# Patient Record
Sex: Female | Born: 1940 | Race: White | Hispanic: No | Marital: Married | Smoking: Never smoker
Health system: Southern US, Community
[De-identification: ages and names within clinical notes are randomized; demographics above are authoritative.]

## PROBLEM LIST (undated history)

## (undated) ENCOUNTER — Emergency Department (HOSPITAL_COMMUNITY): Discharge status: Absent without leave | Payer: Medicare Other

## (undated) DIAGNOSIS — I10 Essential (primary) hypertension: Secondary | ICD-10-CM

## (undated) DIAGNOSIS — K219 Gastro-esophageal reflux disease without esophagitis: Secondary | ICD-10-CM

## (undated) HISTORY — PX: FOOT SURGERY: SHX648

## (undated) HISTORY — PX: KNEE ARTHROPLASTY: SHX992

## (undated) HISTORY — PX: ABDOMINAL HYSTERECTOMY: SHX81

## (undated) HISTORY — PX: ROTATOR CUFF REPAIR: SHX139

---

## 2018-02-04 ENCOUNTER — Encounter: Payer: Self-pay | Admitting: Emergency Medicine

## 2018-02-04 ENCOUNTER — Emergency Department (INDEPENDENT_AMBULATORY_CARE_PROVIDER_SITE_OTHER)
Admission: EM | Admit: 2018-02-04 | Discharge: 2018-02-04 | Disposition: A | Discharge status: Home / Self Care | Payer: Medicare Other | Source: Home / Self Care

## 2018-02-04 DIAGNOSIS — N390 Urinary tract infection, site not specified: Secondary | ICD-10-CM

## 2018-02-04 LAB — POCT URINALYSIS DIP (MANUAL ENTRY)
Bilirubin, UA: NEGATIVE
Glucose, UA: NEGATIVE mg/dL
Nitrite, UA: NEGATIVE
UROBILINOGEN UA: 0.2 U/dL
pH, UA: 5.5 (ref 5.0–8.0)

## 2018-02-04 MED ORDER — CEPHALEXIN 500 MG PO CAPS
500.0000 mg | ORAL_CAPSULE | Freq: Four times a day (QID) | ORAL | 0 refills | Status: DC
Start: 2018-02-04 — End: 2018-02-04

## 2018-02-04 MED ORDER — CEPHALEXIN 500 MG PO CAPS: 500.0000 mg | ORAL_CAPSULE | Freq: Four times a day (QID) | ORAL | 0 refills | Status: DC

## 2018-02-04 NOTE — ED Triage Notes (Signed)
Pt c/o urinary frequency and dysuria x5 days. Afebrile.

## 2018-02-04 NOTE — Discharge Instructions (Signed)
Return if any problems.

## 2018-02-04 NOTE — ED Provider Notes (Signed)
[facility]    CSN: [id number] Arrival date & time: 02/04/18  1912     History   Chief Complaint Chief Complaint  Patient presents with  . Dysuria    HPI Jennifer Novak is a 77 y.o. female.   The history is provided by the patient. No language interpreter was used.  Dysuria  Pain quality:  Aching and burning Pain severity:  Moderate Onset quality:  Gradual Duration:  5 days Timing:  Constant Progression:  Worsening Chronicity:  New Recent urinary tract infections: yes   Relieved by:  Nothing Worsened by:  Nothing Ineffective treatments:  None tried Associated symptoms: no abdominal pain     History reviewed. No pertinent past medical history.  There are no active problems to display for this patient.   Past Surgical History:  Procedure Laterality Date  . ABDOMINAL HYSTERECTOMY    . KNEE SURGERY      OB History   None      Home Medications    Prior to Admission medications   Medication Sig Start Date End Date Taking? Authorizing Provider  benazepril-hydrochlorthiazide (LOTENSIN HCT) 10-12.5 MG tablet Take 1 tablet by mouth daily.   Yes [provider]  estradiol (VIVELLE-DOT) 0.0375 MG/24HR Place 1 patch onto the skin 2 (two) times a week.   Yes [provider]  pantoprazole (PROTONIX) 20 MG tablet Take 20 mg by mouth daily.   Yes [provider]  cephALEXin (KEFLEX) 500 MG capsule Take 1 capsule (500 mg total) by mouth 4 (four) times daily. 02/04/18   Elson AreasSofia, Quavion Boule K, PA-C    Family History History reviewed. No pertinent family history.  Social History Social History   Tobacco Use  . Smoking status: Never Smoker  . Smokeless tobacco: Never Used  Substance Use Topics  . Alcohol use: Not Currently  . Drug use: Not on file     Allergies   Hydrocodone   Review of Systems Review of Systems  Gastrointestinal: Negative for abdominal pain.  Genitourinary: Positive for dysuria.  All other systems reviewed  and are negative.    Physical Exam Triage Vital Signs ED Triage Vitals [02/04/18 1932]  Enc Vitals Group     BP (!) 143/92     Pulse Rate 84     Resp      Temp 97.8 F (36.6 C)     Temp Source Oral     SpO2 99 %     Weight 209 lb (94.8 kg)     Height      Head Circumference      Peak Flow      Pain Score 0     Pain Loc      Pain Edu?      Excl. in GC?    No data found.  Updated Vital Signs BP (!) 143/92 (BP Location: Right Arm)   Pulse 84   Temp 97.8 F (36.6 C) (Oral)   Wt 209 lb (94.8 kg)   SpO2 99%   Visual Acuity Right Eye Distance:   Left Eye Distance:   Bilateral Distance:    Right Eye Near:   Left Eye Near:    Bilateral Near:     Physical Exam  Constitutional: She is oriented to person, place, and time. She appears well-developed and well-nourished.  HENT:  Head: Normocephalic.  Eyes: EOM are normal.  Neck: Normal range of motion.  Pulmonary/Chest: Effort normal.  Abdominal: She exhibits no distension.  Musculoskeletal: Normal range of  motion.  Neurological: She is alert and oriented to person, place, and time.  Psychiatric: She has a normal mood and affect.  Nursing note and vitals reviewed.    UC Treatments / Results  Labs (all labs ordered are listed, but only abnormal results are displayed) Labs Reviewed  POCT URINALYSIS DIP (MANUAL ENTRY) - Abnormal; Notable for the following components:      Result Value   Ketones, POC UA trace (5) (*)    Spec Grav, UA >=1.030 (*)    Blood, UA trace-lysed (*)    Protein Ur, POC trace (*)    Leukocytes, UA Trace (*)    All other components within normal limits  URINE CULTURE    EKG None Radiology No results found.  Procedures Procedures (including critical care time)  Medications Ordered in UC Medications - No data to display   Initial Impression / Assessment and Plan / UC Course  I have reviewed the triage vital signs and the nursing notes.  Pertinent labs & imaging results that were  available during my care of the patient were reviewed by me and considered in my medical decision making (see chart for details).     MDM  Urine culture ordered.  Pt given Rx for keflex.   Final Clinical Impressions(s) / UC Diagnoses   Final diagnoses:  Urinary tract infection without hematuria, site unspecified    ED Discharge Orders        Ordered    cephALEXin (KEFLEX) 500 MG capsule  4 times daily,   Status:  Discontinued     02/04/18 1940    cephALEXin (KEFLEX) 500 MG capsule  4 times daily     02/04/18 1941       Controlled Substance Prescriptions [place] Controlled Substance Registry consulted? Not Applicable   Elson Areas, [place] 02/04/18 1944

## 2018-02-05 LAB — URINE CULTURE
MICRO NUMBER:: 90413402
SPECIMEN QUALITY: ADEQUATE

## 2018-02-06 ENCOUNTER — Telehealth: Payer: Self-pay | Admitting: Emergency Medicine

## 2018-02-06 NOTE — Telephone Encounter (Signed)
Spoke with patient; she is feeling better; gave her urine culture results; encouraged her to complete rx.

## 2018-03-27 ENCOUNTER — Emergency Department (HOSPITAL_BASED_OUTPATIENT_CLINIC_OR_DEPARTMENT_OTHER): Payer: Medicare Other

## 2018-03-27 ENCOUNTER — Encounter (HOSPITAL_BASED_OUTPATIENT_CLINIC_OR_DEPARTMENT_OTHER): Payer: Self-pay

## 2018-03-27 ENCOUNTER — Emergency Department (INDEPENDENT_AMBULATORY_CARE_PROVIDER_SITE_OTHER)
Admission: EM | Admit: 2018-03-27 | Discharge: 2018-03-27 | Disposition: A | Discharge status: Home / Self Care | Payer: Medicare Other | Source: Home / Self Care | Attending: Emergency Medicine | Admitting: Emergency Medicine

## 2018-03-27 ENCOUNTER — Other Ambulatory Visit: Payer: Self-pay

## 2018-03-27 ENCOUNTER — Emergency Department (HOSPITAL_BASED_OUTPATIENT_CLINIC_OR_DEPARTMENT_OTHER)
Admission: EM | Admit: 2018-03-27 | Discharge: 2018-03-27 | Disposition: A | Discharge status: Home / Self Care | Payer: Medicare Other | Attending: Emergency Medicine | Admitting: Emergency Medicine

## 2018-03-27 ENCOUNTER — Encounter: Payer: Self-pay | Admitting: *Deleted

## 2018-03-27 DIAGNOSIS — I10 Essential (primary) hypertension: Secondary | ICD-10-CM | POA: Insufficient documentation

## 2018-03-27 DIAGNOSIS — R221 Localized swelling, mass and lump, neck: Secondary | ICD-10-CM | POA: Diagnosis present

## 2018-03-27 DIAGNOSIS — Z79899 Other long term (current) drug therapy: Secondary | ICD-10-CM | POA: Diagnosis not present

## 2018-03-27 DIAGNOSIS — K1120 Sialoadenitis, unspecified: Secondary | ICD-10-CM | POA: Insufficient documentation

## 2018-03-27 HISTORY — DX: Essential (primary) hypertension: I10

## 2018-03-27 LAB — POCT CBC W AUTO DIFF (K'VILLE URGENT CARE)

## 2018-03-27 LAB — BASIC METABOLIC PANEL
Anion gap: 10 (ref 5–15)
BUN: 9 mg/dL (ref 6–20)
CHLORIDE: 100 mmol/L — AB (ref 101–111)
CO2: 25 mmol/L (ref 22–32)
CREATININE: 0.63 mg/dL (ref 0.44–1.00)
Calcium: 8.7 mg/dL — ABNORMAL LOW (ref 8.9–10.3)
GFR calc Af Amer: >60 mL/min (ref >60)
GFR calc non Af Amer: >60 mL/min (ref >60)
GLUCOSE: 102 mg/dL — AB (ref 65–99)
POTASSIUM: 3.1 mmol/L — AB (ref 3.5–5.1)
SODIUM: 135 mmol/L (ref 135–145)

## 2018-03-27 LAB — POCT RAPID STREP A (OFFICE): Rapid Strep A Screen: NEGATIVE

## 2018-03-27 MED ORDER — CLINDAMYCIN HCL 150 MG PO CAPS
300.0000 mg | ORAL_CAPSULE | Freq: Once | ORAL | Status: AC
  Administered 2018-03-27: 300 mg via ORAL
  Filled 2018-03-27: qty 2

## 2018-03-27 MED ORDER — IOPAMIDOL (ISOVUE-300) INJECTION 61%
100.0000 mL | Freq: Once | INTRAVENOUS | Status: AC | PRN
  Administered 2018-03-27: 75 mL via INTRAVENOUS

## 2018-03-27 MED ORDER — CLINDAMYCIN HCL 150 MG PO CAPS: 300.0000 mg | ORAL_CAPSULE | Freq: Three times a day (TID) | ORAL | 0 refills | Status: DC

## 2018-03-27 NOTE — ED Notes (Signed)
ED Provider at bedside. 

## 2018-03-27 NOTE — ED Triage Notes (Signed)
Pt presents with swelling to the left side of her neck since Tuesday, swelling worsened today. Pt reports "spitting up a lot" this morning, pt can speak in complete sentences. Pt sent from [facility]. Neg strep test.

## 2018-03-27 NOTE — ED Provider Notes (Signed)
[facility] EMERGENCY DEPARTMENT Provider Note   CSN: [id number] Arrival date & time: 03/27/18  2841     History   Chief Complaint No chief complaint on file.   HPI Jennifer Novak is a 77 y.o. female.  The history is provided by the patient. No language interpreter was used.    Jennifer Novak is a 77 y.o. female who presents to the Emergency Department complaining of neck pain/swelling. She reports that three days to swelling and discomfort to her left neck/mandibular region. Initially she had a discomfort in that area and then yesterday she noticed some swelling as well as worsening pain. She has pain with swallowing and when she woke this morning and she was unable to follow her own secretions. The area feels slightly warm. She also has soreness under her tongue. No prior similar symptoms. No fevers, cough, shortness of breath. She was seen in urgent care this morning and referred to the emergency department for further evaluation. She states that since she was seen in the urgent care the swelling has gone down and now she can swallow her and saliva.  Past Medical History:  Diagnosis Date  . Hypertension     There are no active problems to display for this patient.   Past Surgical History:  Procedure Laterality Date  . ABDOMINAL HYSTERECTOMY    . KNEE SURGERY       OB History   None      Home Medications    Prior to Admission medications   Medication Sig Start Date End Date Taking? Authorizing Provider  benazepril-hydrochlorthiazide (LOTENSIN HCT) 10-12.5 MG tablet Take 1 tablet by mouth daily.    [provider]  clindamycin (CLEOCIN) 150 MG capsule Take 2 capsules (300 mg total) by mouth 3 (three) times daily. 03/27/18   Tilden Fossa, MD  estradiol (VIVELLE-DOT) 0.0375 MG/24HR Place 1 patch onto the skin 2 (two) times a week.    [provider]  pantoprazole (PROTONIX) 20 MG tablet Take 20 mg by mouth daily.    [provider]     Family History No family history on file.  Social History Social History   Tobacco Use  . Smoking status: Never Smoker  . Smokeless tobacco: Never Used  Substance Use Topics  . Alcohol use: Not Currently  . Drug use: Not on file     Allergies   Codeine; Hydrocodone; Sulfa antibiotics; and Penicillins   Review of Systems Review of Systems  All other systems reviewed and are negative.    Physical Exam Updated Vital Signs BP (!) 163/98 (BP Location: Right Arm)   Pulse 77   Temp 98.1 F (36.7 C) (Oral)   Resp 18   Ht  (1.727 m)   Wt 96.2 kg (212 lb)   SpO2 99%   BMI 32.23 kg/m   Physical Exam  Constitutional: She is oriented to person, place, and time. She appears well-developed and well-nourished.  HENT:  Head: Normocephalic and atraumatic.  Left cochlear implant. TMs clear bilaterally. Oropharynx without posterior oropharynx without erythema or edema. There is minimal sublingual swelling and tenderness with mild erythema. Moderate erythema and swelling with local tenderness over the left submandibular region. No thyroid Magaly or cervical lymphadenopathy.  Neck: Neck supple.  Cardiovascular: Normal rate and regular rhythm.  No murmur heard. Pulmonary/Chest: Effort normal and breath sounds normal. No stridor. No respiratory distress.  Musculoskeletal: She exhibits no edema or tenderness.  Neurological: She is alert and oriented to  person, place, and time.  Skin: Skin is warm and dry.  Psychiatric: She has a normal mood and affect. Her behavior is normal.  Nursing note and vitals reviewed.    ED Treatments / Results  Labs (all labs ordered are listed, but only abnormal results are displayed) Labs Reviewed  BASIC METABOLIC PANEL - Abnormal; Notable for the following components:      Result Value   Potassium 3.1 (*)    Chloride 100 (*)    Glucose, Bld 102 (*)    Calcium 8.7 (*)    All other components within normal limits     EKG None  Radiology Ct Soft Tissue Neck W Contrast  Result Date: 03/27/2018 CLINICAL DATA:  Abscess salivary gland. Left submandibular pain and swelling EXAM: CT NECK WITH CONTRAST TECHNIQUE: Multidetector CT imaging of the neck was performed using the standard protocol following the bolus administration of intravenous contrast. CONTRAST:  75mL ISOVUE-300 IOPAMIDOL (ISOVUE-300) INJECTION 61% COMPARISON:  None. FINDINGS: Pharynx and larynx: Pharyngeal edema on the left related to submandibular inflammation. There is edema in the left vallecula and left lateral larynx. No airway compromise. No pharyngeal mass. Salivary glands: Enlargement of the left submandibular gland with surrounding edema. Probable left submandibular gland calculus which is largely obscured by extensive dental artifact. The patient has multiple dental implants in the mandible. This presumed stone measures approximately 3 x 5 mm. No abscess. Right submandibular gland normal.  Parotid gland normal bilaterally. Thyroid: Negative Lymph nodes: Small submandibular lymph nodes are present on the left likely reactive. The largest node measures 8 mm. No malignant adenopathy Vascular: Left jugular vein dominant. Prominent external jugular vein branches on the left. No venous obstructive disease. Normal arterial enhancement. 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 Paranasal sinuses clear. Mastoid sinus clear. Mastoidectomy on the left which is clear. Skeleton: Multilevel disc and facet degeneration. No acute skeletal abnormality. Upper chest: Negative Other: None IMPRESSION: Enlargement of the left submandibular gland with surrounding edema. No abscess. 3 x 5 mm obstructing stone in the left submandibular duct, largely obscured by artifact from dental implants. Edema extends into the left lateral larynx and left vallecula without airway  compromise. Electronically Signed   By: Marlan Palau M.D.   On: 03/27/2018 12:04    Procedures Procedures (including critical care time)  Medications Ordered in ED Medications  iopamidol (ISOVUE-300) 61 % injection 100 mL (75 mLs Intravenous Contrast Given 03/27/18 1124)  clindamycin (CLEOCIN) capsule 300 mg (300 mg Oral Given 03/27/18 1243)     Initial Impression / Assessment and Plan / ED Course  I have reviewed the triage vital signs and the nursing notes.  Pertinent labs & imaging results that were available during my care of the patient were reviewed by me and considered in my medical decision making (see chart for details).     Patient here for evaluation of left sided jaw/neck swelling for the last three days. Her symptoms have improved since waking this morning. She does have erythema and fullness to the left submandibular area. No clinical features concerning for Ludwig's angina. She is non-toxic appearing on examination without Strider, tolerating her secretions without difficulty. CT scan does demonstrate a 3 x 5 mm submandibular stone. Discussed the case with Dr. Pollyann Kennedy with ENT who will see the patient in follow-up. No definite evidence of infection at this time but will initiate empiric antibiotics given patient's report of area being very hot  in inflamed prior to arrival. Discussed with patient importance of close outpatient follow-up and return precautions.  Final Clinical Impressions(s) / ED Diagnoses   Final diagnoses:  Sialoadenitis of submandibular gland    ED Discharge Orders        Ordered    clindamycin (CLEOCIN) 150 MG capsule  3 times daily     03/27/18 1240       Tilden Fossa, MD 03/27/18 1546

## 2018-03-27 NOTE — ED Notes (Signed)
CT waiting for results of bun/creat/egfr prior to imaging with iv contrast per [facility]ology protocol Pt age >60 yrs

## 2018-03-27 NOTE — ED Provider Notes (Signed)
[facility]    CSN: [id number] Arrival date & time: 03/27/18  0801     History   Chief Complaint Chief Complaint  Patient presents with  . Sore Throat  . neck swelling    HPI Jennifer Novak is a 77 y.o. female.  Patient was in her usual state of health until Tuesday when she developed some pain on the left side of her mouth and throat.  She noticed to firm areas just beneath the left jawline.  Over the last 72 hours she has had progressive swelling and has recently developed some difficulty with coughing up whitish phlegm which has been accumulating in the back of her throat.  She states a temperature of 98 6 is a fever for her.  She has had no chills.  She has a cochlear implant on the left but has had no recent problems with this but does have pain in her  left ear. of note she is also on an ACE inhibitor she started in January  Sore Throat     Past Medical History:  Diagnosis Date  . Hypertension     There are no active problems to display for this patient.   Past Surgical History:  Procedure Laterality Date  . ABDOMINAL HYSTERECTOMY    . KNEE SURGERY      OB History   None      Home Medications    Prior to Admission medications   Medication Sig Start Date End Date Taking? Authorizing Provider  benazepril-hydrochlorthiazide (LOTENSIN HCT) 10-12.5 MG tablet Take 1 tablet by mouth daily.    [provider]  clindamycin (CLEOCIN) 150 MG capsule Take 2 capsules (300 mg total) by mouth 3 (three) times daily. 03/27/18   Tilden Fossa, MD  estradiol (VIVELLE-DOT) 0.0375 MG/24HR Place 1 patch onto the skin 2 (two) times a week.    [provider]  pantoprazole (PROTONIX) 20 MG tablet Take 20 mg by mouth daily.    [provider]    Family History History reviewed. No pertinent family history.  Social History Social History   Tobacco Use  . Smoking status: Never Smoker  . Smokeless tobacco: Never Used  Substance Use  Topics  . Alcohol use: Not Currently  . Drug use: Not on file     Allergies   Codeine; Hydrocodone; Sulfa antibiotics; and Penicillins   Review of Systems Review of Systems  Constitutional: Negative for activity change, chills and fever.  HENT: Positive for sore throat, trouble swallowing and voice change. Negative for congestion and sinus pain.   Eyes: Negative.   Respiratory: Negative.   Cardiovascular: Negative.   Gastrointestinal: Negative.      Physical Exam Triage Vital Signs ED Triage Vitals [03/27/18 0816]  Enc Vitals Group     BP (!) 174/79     Pulse Rate 77     Resp 14     Temp 98.6 F (37 C)     Temp Source Oral     SpO2 99 %     Weight 212 lb (96.2 kg)     Height      Head Circumference      Peak Flow      Pain Score 6     Pain Loc      Pain Edu?      Excl. in GC?    No data found.  Updated Vital Signs BP (!) 174/79 (BP Location: Right Arm)   Pulse 77   Temp 98.6  F (37 C) (Oral)   Resp 14   Wt 212 lb (96.2 kg)   SpO2 99%   Visual Acuity Right Eye Distance:   Left Eye Distance:   Bilateral Distance:    Right Eye Near:   Left Eye Near:    Bilateral Near:     Physical Exam  Constitutional: She appears well-developed and well-nourished.  HENT:  Head: Normocephalic.  Right Ear: Tympanic membrane normal.  Left Ear: Tympanic membrane normal.  Mouth/Throat: Mucous membranes are normal.  There is a cochlear implant on the left.  There is swelling left posterior pharynx.  There is a puffiness along the left side of the face which begins at the middle portion of the left mandible extending to the left side of the neck.  There is no definite mass in this area.  There is no exudate on the posterior pharynx but it does appear to be edematous.  Neck: Normal range of motion.  Cardiovascular: Normal rate.  Pulmonary/Chest: Effort normal and breath sounds normal.     UC Treatments / Results  Labs (all labs ordered are listed, but only abnormal  results are displayed) Labs Reviewed  STREP A DNA PROBE  POCT CBC W AUTO DIFF ([facility])  POCT RAPID STREP A (OFFICE)    EKG None  Radiology Ct Soft Tissue Neck W Contrast  Result Date: 03/27/2018 CLINICAL DATA:  Abscess salivary gland. Left submandibular pain and swelling EXAM: CT NECK WITH CONTRAST TECHNIQUE: Multidetector CT imaging of the neck was performed using the standard protocol following the bolus administration of intravenous contrast. CONTRAST:  75mL ISOVUE-300 IOPAMIDOL (ISOVUE-300) INJECTION 61% COMPARISON:  None. FINDINGS: Pharynx and larynx: Pharyngeal edema on the left related to submandibular inflammation. There is edema in the left vallecula and left lateral larynx. No airway compromise. No pharyngeal mass. Salivary glands: Enlargement of the left submandibular gland with surrounding edema. Probable left submandibular gland calculus which is largely obscured by extensive dental artifact. The patient has multiple dental implants in the mandible. This presumed stone measures approximately 3 x 5 mm. No abscess. Right submandibular gland normal.  Parotid gland normal bilaterally. Thyroid: Negative Lymph nodes: Small submandibular lymph nodes are present on the left likely reactive. The largest node measures 8 mm. No malignant adenopathy Vascular: Left jugular vein dominant. Prominent external jugular vein branches on the left. No venous obstructive disease. Normal arterial enhancement. 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 Paranasal sinuses clear. Mastoid sinus clear. Mastoidectomy on the left which is clear. Skeleton: Multilevel disc and facet degeneration. No acute skeletal abnormality. Upper chest: Negative Other: None IMPRESSION: Enlargement of the left submandibular gland with surrounding edema. No abscess. 3 x 5 mm obstructing stone in the left submandibular  duct, largely obscured by artifact from dental implants. Edema extends into the left lateral larynx and left vallecula without airway compromise. Electronically Signed   By: Marlan Palau M.D.   On: 03/27/2018 12:04    Procedures Procedures (including critical care time)  Medications Ordered in UC Medications - No data to display  Initial Impression / Assessment and Plan / UC Course  I have reviewed the triage vital signs and the nursing notes.  Pertinent labs & imaging results that were available during my care of the patient were reviewed by me and considered in my medical decision making (see chart for details). White cell count 5500 with hemoglobin 11.9 platelets 263,000.  Strep test negative.  Discussed case with the radiologist.  She will need renal function testing prior to CT of the neck.  I do have concerns about being on an ACE inhibitor and have advised her not to take this medication at the present time.  I discussed the case with the emergency room physician.  At [facility] facility on [address].  She was agreeable to do an evaluation through the emergency room.  Of note at the emergency room CT was done and revealed a stone involving the left submaxillary gland.     Final Clinical Impressions(s) / UC Diagnoses   Final diagnoses:  Localized swelling, mass and lump, neck     Discharge Instructions     Please go to the [facility] facility off [address]    ED Prescriptions    None     Controlled Substance Prescriptions [place] Controlled Substance Registry consulted? Not Applicable   Collene Gobble, MD 03/27/18 1453

## 2018-03-27 NOTE — ED Notes (Signed)
Patient transported to CT 

## 2018-03-27 NOTE — Discharge Instructions (Signed)
Please go to the [facility] facility [address].

## 2018-03-27 NOTE — ED Triage Notes (Signed)
Patient reports that 2 days ago after eating popcorn, feeling a soreness behind her dental implant on the left lower jaw. Pain has progressed and last night developed swelling on the left side of her neck. She report "Im spitting up white stuff". She denies a cough.

## 2018-03-28 ENCOUNTER — Encounter (HOSPITAL_COMMUNITY): Payer: Self-pay | Admitting: *Deleted

## 2018-03-28 ENCOUNTER — Ambulatory Visit (HOSPITAL_COMMUNITY): Payer: Medicare Other | Admitting: Anesthesiology

## 2018-03-28 ENCOUNTER — Ambulatory Visit (HOSPITAL_COMMUNITY)
Admit: 2018-03-28 | Discharge: 2018-03-28 | Disposition: A | Discharge status: Home / Self Care | Payer: Medicare Other | Source: Other Acute Inpatient Hospital | Attending: Otolaryngology | Admitting: Otolaryngology

## 2018-03-28 ENCOUNTER — Encounter (HOSPITAL_COMMUNITY)
Disposition: A | Discharge status: Home / Self Care | Payer: Self-pay | Source: Other Acute Inpatient Hospital | Attending: Otolaryngology

## 2018-03-28 DIAGNOSIS — K118 Other diseases of salivary glands: Secondary | ICD-10-CM | POA: Insufficient documentation

## 2018-03-28 DIAGNOSIS — K219 Gastro-esophageal reflux disease without esophagitis: Secondary | ICD-10-CM | POA: Insufficient documentation

## 2018-03-28 DIAGNOSIS — I10 Essential (primary) hypertension: Secondary | ICD-10-CM | POA: Insufficient documentation

## 2018-03-28 DIAGNOSIS — Z79899 Other long term (current) drug therapy: Secondary | ICD-10-CM | POA: Insufficient documentation

## 2018-03-28 DIAGNOSIS — Z882 Allergy status to sulfonamides status: Secondary | ICD-10-CM | POA: Insufficient documentation

## 2018-03-28 DIAGNOSIS — Z885 Allergy status to narcotic agent status: Secondary | ICD-10-CM | POA: Diagnosis not present

## 2018-03-28 DIAGNOSIS — Z881 Allergy status to other antibiotic agents status: Secondary | ICD-10-CM | POA: Diagnosis not present

## 2018-03-28 DIAGNOSIS — Z880 Allergy status to penicillin: Secondary | ICD-10-CM | POA: Diagnosis not present

## 2018-03-28 HISTORY — DX: Gastro-esophageal reflux disease without esophagitis: K21.9

## 2018-03-28 HISTORY — PX: SALIVARY STONE REMOVAL: SHX5213

## 2018-03-28 LAB — STREP A DNA PROBE: GROUP A STREP PROBE: NOT DETECTED

## 2018-03-28 SURGERY — REMOVAL, CALCULUS, SALIVARY DUCT
Anesthesia: General | Site: Mouth

## 2018-03-28 MED ORDER — SUCCINYLCHOLINE CHLORIDE 200 MG/10ML IV SOSY
PREFILLED_SYRINGE | INTRAVENOUS | Status: AC
  Filled 2018-03-28: qty 20

## 2018-03-28 MED ORDER — ONDANSETRON HCL 4 MG/2ML IJ SOLN
INTRAMUSCULAR | Status: AC
  Filled 2018-03-28: qty 2

## 2018-03-28 MED ORDER — LIDOCAINE-EPINEPHRINE 1 %-1:100000 IJ SOLN
INTRAMUSCULAR | Status: AC
  Filled 2018-03-28: qty 1

## 2018-03-28 MED ORDER — FENTANYL CITRATE (PF) 100 MCG/2ML IJ SOLN
25.0000 ug | INTRAMUSCULAR | Status: DC | PRN
  Administered 2018-03-28 (×2): 25 ug via INTRAVENOUS

## 2018-03-28 MED ORDER — LIDOCAINE 2% (20 MG/ML) 5 ML SYRINGE
INTRAMUSCULAR | Status: DC | PRN
Start: 2018-03-28 — End: 2018-03-28
  Administered 2018-03-28: 50 mg via INTRAVENOUS

## 2018-03-28 MED ORDER — FENTANYL CITRATE (PF) 250 MCG/5ML IJ SOLN
INTRAMUSCULAR | Status: AC
  Filled 2018-03-28: qty 5

## 2018-03-28 MED ORDER — ONDANSETRON HCL 4 MG/2ML IJ SOLN
INTRAMUSCULAR | Status: DC | PRN
  Administered 2018-03-28: 4 mg via INTRAVENOUS

## 2018-03-28 MED ORDER — SUCCINYLCHOLINE CHLORIDE 20 MG/ML IJ SOLN
INTRAMUSCULAR | Status: DC | PRN
  Administered 2018-03-28: 120 mg via INTRAVENOUS

## 2018-03-28 MED ORDER — PHENYLEPHRINE HCL 10 MG/ML IJ SOLN
INTRAMUSCULAR | Status: DC | PRN
  Administered 2018-03-28 (×2): 80 ug via INTRAVENOUS

## 2018-03-28 MED ORDER — DEXAMETHASONE SODIUM PHOSPHATE 10 MG/ML IJ SOLN
INTRAMUSCULAR | Status: DC | PRN
  Administered 2018-03-28: 10 mg via INTRAVENOUS

## 2018-03-28 MED ORDER — FENTANYL CITRATE (PF) 250 MCG/5ML IJ SOLN
INTRAMUSCULAR | Status: DC | PRN
  Administered 2018-03-28: 50 ug via INTRAVENOUS
  Administered 2018-03-28: 100 ug via INTRAVENOUS
  Administered 2018-03-28: 50 ug via INTRAVENOUS

## 2018-03-28 MED ORDER — LIDOCAINE-EPINEPHRINE 1 %-1:100000 IJ SOLN
INTRAMUSCULAR | Status: DC | PRN
  Administered 2018-03-28: 3 mL

## 2018-03-28 MED ORDER — DEXAMETHASONE SODIUM PHOSPHATE 10 MG/ML IJ SOLN
INTRAMUSCULAR | Status: AC
  Filled 2018-03-28: qty 1

## 2018-03-28 MED ORDER — KETOROLAC TROMETHAMINE 30 MG/ML IJ SOLN
30.0000 mg | Freq: Once | INTRAMUSCULAR | Status: AC | PRN
  Administered 2018-03-28: 30 mg via INTRAVENOUS

## 2018-03-28 MED ORDER — PROPOFOL 10 MG/ML IV BOLUS
INTRAVENOUS | Status: DC | PRN
  Administered 2018-03-28: 150 mg via INTRAVENOUS

## 2018-03-28 MED ORDER — KETOROLAC TROMETHAMINE 30 MG/ML IJ SOLN
INTRAMUSCULAR | Status: AC
  Filled 2018-03-28: qty 1

## 2018-03-28 MED ORDER — ARTIFICIAL TEARS OPHTHALMIC OINT
TOPICAL_OINTMENT | OPHTHALMIC | Status: AC
  Filled 2018-03-28: qty 3.5

## 2018-03-28 MED ORDER — HYDROCODONE-ACETAMINOPHEN 7.5-325 MG PO TABS: 1.0000 | ORAL_TABLET | Freq: Four times a day (QID) | ORAL | 0 refills | Status: DC | PRN

## 2018-03-28 MED ORDER — 0.9 % SODIUM CHLORIDE (POUR BTL) OPTIME
TOPICAL | Status: DC | PRN
  Administered 2018-03-28: 1000 mL

## 2018-03-28 MED ORDER — FENTANYL CITRATE (PF) 100 MCG/2ML IJ SOLN
INTRAMUSCULAR | Status: AC
  Filled 2018-03-28: qty 2

## 2018-03-28 MED ORDER — PROMETHAZINE HCL 25 MG/ML IJ SOLN: 6.2500 mg | INTRAMUSCULAR | Status: DC | PRN

## 2018-03-28 MED ORDER — PROMETHAZINE HCL 25 MG RE SUPP: 25.0000 mg | Freq: Four times a day (QID) | RECTAL | 1 refills | Status: DC | PRN

## 2018-03-28 MED ORDER — PHENYLEPHRINE 40 MCG/ML (10ML) SYRINGE FOR IV PUSH (FOR BLOOD PRESSURE SUPPORT)
PREFILLED_SYRINGE | INTRAVENOUS | Status: AC
  Filled 2018-03-28: qty 10

## 2018-03-28 MED ORDER — SUGAMMADEX SODIUM 200 MG/2ML IV SOLN
INTRAVENOUS | Status: AC
  Filled 2018-03-28: qty 2

## 2018-03-28 MED ORDER — ROCURONIUM BROMIDE 10 MG/ML (PF) SYRINGE
PREFILLED_SYRINGE | INTRAVENOUS | Status: AC
  Filled 2018-03-28: qty 5

## 2018-03-28 MED ORDER — ROCURONIUM BROMIDE 10 MG/ML (PF) SYRINGE
PREFILLED_SYRINGE | INTRAVENOUS | Status: DC | PRN
Start: 2018-03-28 — End: 2018-03-28
  Administered 2018-03-28: 30 mg via INTRAVENOUS

## 2018-03-28 MED ORDER — LACTATED RINGERS IV SOLN
INTRAVENOUS | Status: DC
  Administered 2018-03-28: 10:00:00 via INTRAVENOUS

## 2018-03-28 MED ORDER — PROPOFOL 10 MG/ML IV BOLUS
INTRAVENOUS | Status: AC
Start: 2018-03-28 — End: ?
  Filled 2018-03-28: qty 20

## 2018-03-28 SURGICAL SUPPLY — 20 items
BLADE SURG 15 STRL LF DISP TIS (BLADE) IMPLANT
BLADE SURG 15 STRL SS (BLADE)
CANISTER SUCT 3000ML PPV (MISCELLANEOUS) ×3 IMPLANT
CONT SPEC 4OZ CLIKSEAL STRL BL (MISCELLANEOUS) IMPLANT
COVER SURGICAL LIGHT HANDLE (MISCELLANEOUS) ×3 IMPLANT
DRAPE HALF SHEET 40X57 (DRAPES) IMPLANT
ELECT COATED BLADE 2.86 ST (ELECTRODE) ×3 IMPLANT
ELECT REM PT RETURN 9FT ADLT (ELECTROSURGICAL) ×3
ELECTRODE REM PT RTRN 9FT ADLT (ELECTROSURGICAL) ×1 IMPLANT
GAUZE SPONGE 4X4 16PLY XRAY LF (GAUZE/BANDAGES/DRESSINGS) IMPLANT
GLOVE ECLIPSE 7.5 STRL STRAW (GLOVE) ×3 IMPLANT
GOWN STRL REUS W/ TWL LRG LVL3 (GOWN DISPOSABLE) ×2 IMPLANT
GOWN STRL REUS W/TWL LRG LVL3 (GOWN DISPOSABLE) ×4
KIT BASIN OR (CUSTOM PROCEDURE TRAY) ×3 IMPLANT
KIT TURNOVER KIT B (KITS) ×3 IMPLANT
NEEDLE PRECISIONGLIDE 27X1.5 (NEEDLE) ×3 IMPLANT
NS IRRIG 1000ML POUR BTL (IV SOLUTION) ×3 IMPLANT
PAD ARMBOARD 7.5X6 YLW CONV (MISCELLANEOUS) ×6 IMPLANT
PENCIL FOOT CONTROL (ELECTRODE) ×3 IMPLANT
TRAY ENT MC OR (CUSTOM PROCEDURE TRAY) ×3 IMPLANT

## 2018-03-28 NOTE — Op Note (Signed)
OPERATIVE REPORT  DATE OF SURGERY: 03/28/2018  PATIENT:  Jennifer Novak,  77 y.o. female  PRE-OPERATIVE DIAGNOSIS:  blocked salivary gland  POST-OPERATIVE DIAGNOSIS:  blocked salivary gland  PROCEDURE:  Procedure(s): TRANSORAL SALIVARY STONE REMOVAL/SIALODUCOPLASTY  SURGEON:  Susy Frizzle, MD  ASSISTANTS: None  ANESTHESIA:   General   EBL: 20 ml  DRAINS: None  LOCAL MEDICATIONS USED: 1% Xylocaine with epinephrine  SPECIMEN:  none  COUNTS:  Correct  PROCEDURE DETAILS: The patient was taken to the operating room and placed on the operating table in the supine position. Following induction of general endotracheal anesthesia, the face was draped in the standard fashion.  A bite block was used to keep the dentition separated.  The floor of mouth was inspected.  I was unable to visualize a normal puncta on either side in large part due to the obstruction caused by the dental implants.  The gland was compressed looking for any presence of secretions and none were identified.  An incision was created in the left anterior floor of mouth using electrocautery.  The mucosa was divided and the submucosal layers were bluntly dissected searching for a dilated duct.  A duct was never identified.  Palpation of the entire area all the way back to the hilum of the gland did not reveal a definite stone.  Repeated palpation of the gland produced no secretions.  After multiple attempts to identify either the duct or stone, unsuccessfully, the case was ended.  The oral cavity was irrigated with saline and suction.  Electrocautery was used for hemostasis.  Patient was awakened extubated and transferred to recovery in stable condition.    PATIENT DISPOSITION:  To PACU, stable

## 2018-03-28 NOTE — Transfer of Care (Signed)
Immediate Anesthesia Transfer of Care Note  Patient: Jennifer Novak  Procedure(s) Performed: TRANSORAL SALIVARY STONE REMOVAL/SIALODUCOPLASTY (N/A Mouth)  Patient Location: PACU  Anesthesia Type:General  Level of Consciousness: drowsy and responds to stimulation  Airway & Oxygen Therapy: Patient Spontanous Breathing and Patient connected to nasal cannula oxygen  Post-op Assessment: Report given to RN and Post -op Vital signs reviewed and stable  Post vital signs: Reviewed and stable  Last Vitals:  Vitals Value Taken Time  BP 156/78 03/28/2018 12:59 PM  Temp    Pulse 90 03/28/2018  1:03 PM  Resp 17 03/28/2018  1:03 PM  SpO2 89 % 03/28/2018  1:03 PM  Vitals shown include unvalidated device data.  Last Pain:  Vitals:   03/28/18 1009  TempSrc: Oral  PainSc: 6       Patients Stated Pain Goal: 4 (03/28/18 1009)  Complications: No apparent anesthesia complications

## 2018-03-28 NOTE — Progress Notes (Signed)
Pt came to PACU with right hearing aid in place. Spouse returned external cochlear device to pt.

## 2018-03-28 NOTE — H&P (Signed)
Jennifer Novak is an 77 y.o. female.   Chief Complaint: Neck swelling HPI: 2-day history of left submandibular swelling and swelling and pain under the left side of the tongue anteriorly.  CT scan revealed a small calculus in the left submandibular duct.  She was treated with oral antibiotics but had a bad reaction so she was unable to continue that.  Seen in the office yesterday, swelling worsened this morning.  Past Medical History:  Diagnosis Date  . Hypertension     Past Surgical History:  Procedure Laterality Date  . ABDOMINAL HYSTERECTOMY    . KNEE SURGERY      No family history on file. Social History:  reports that she has never smoked. She has never used smokeless tobacco. She reports that she drank alcohol. Her drug history is not on file.  Allergies:  Allergies  Allergen Reactions  . Clindamycin/Lincomycin Diarrhea  . Codeine Nausea And Vomiting    Other reaction(s): Vomiting (intolerance)  . Hydrocodone   . Sulfa Antibiotics   . Penicillins Dermatitis and Rash    Has patient had a PCN reaction causing immediate rash, facial/tongue/throat swelling, SOB or lightheadedness with hypotension: Yes Has patient had a PCN reaction causing severe rash involving mucus membranes or skin necrosis: Yes Has patient had a PCN reaction that required hospitalization: No Has patient had a PCN reaction occurring within the last 10 years: No If all of the above answers are "NO", then may proceed with Cephalosporin use.     Medications Prior to Admission  Medication Sig Dispense Refill  . estradiol (CLIMARA - DOSED IN MG/24 HR) 0.1 mg/24hr patch Place 1 patch onto the skin once a week.     . hydrochlorothiazide (HYDRODIURIL) 25 MG tablet Take 25 mg by mouth daily.  3  . pantoprazole (PROTONIX) 20 MG tablet Take 20 mg by mouth daily.    . clindamycin (CLEOCIN) 150 MG capsule Take 2 capsules (300 mg total) by mouth 3 (three) times daily. (Patient not taking: Reported on 03/28/2018) 42  capsule 0    Results for orders placed or performed during the hospital encounter of 03/27/18 (from the past 48 hour(s))  Basic metabolic panel     Status: Abnormal   Collection Time: 03/27/18 10:50 AM  Result Value Ref Range   Sodium 135 135 - 145 mmol/L   Potassium 3.1 (L) 3.5 - 5.1 mmol/L   Chloride 100 (L) 101 - 111 mmol/L   CO2 25 22 - 32 mmol/L   Glucose, Bld 102 (H) 65 - 99 mg/dL   BUN 9 6 - 20 mg/dL   Creatinine, Ser 0.63 0.44 - 1.00 mg/dL   Calcium 8.7 (L) 8.9 - 10.3 mg/dL   GFR calc non Af Amer >60 >60 mL/min   GFR calc Af Amer >60 >60 mL/min    Comment: (NOTE) The eGFR has been calculated using the CKD EPI equation. This calculation has not been validated in all clinical situations. eGFR's persistently <60 mL/min signify possible Chronic Kidney Disease.    Anion gap 10 5 - 15    Comment: Performed at [facility], [address] [place], [place] [address]   Ct Soft Tissue Neck W Contrast  Result Date: 03/27/2018 CLINICAL DATA:  Abscess salivary gland. Left submandibular pain and swelling EXAM: CT NECK WITH CONTRAST TECHNIQUE: Multidetector CT imaging of the neck was performed using the standard protocol following the bolus administration of intravenous contrast. CONTRAST:  29m ISOVUE-300 IOPAMIDOL (ISOVUE-300) INJECTION 61% COMPARISON:  None. FINDINGS: Pharynx and larynx: Pharyngeal edema on the left related to submandibular inflammation. There is edema in the left vallecula and left lateral larynx. No airway compromise. No pharyngeal mass. Salivary glands: Enlargement of the left submandibular gland with surrounding edema. Probable left submandibular gland calculus which is largely obscured by extensive dental artifact. The patient has multiple dental implants in the mandible. This presumed stone measures approximately 3 x 5 mm. No abscess. Right submandibular gland normal.  Parotid gland normal bilaterally. Thyroid: Negative Lymph nodes: Small submandibular lymph  nodes are present on the left likely reactive. The largest node measures 8 mm. No malignant adenopathy Vascular: Left jugular vein dominant. Prominent external jugular vein branches on the left. No venous obstructive disease. Normal arterial enhancement. 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 Paranasal sinuses clear. Mastoid sinus clear. Mastoidectomy on the left which is clear. Skeleton: Multilevel disc and facet degeneration. No acute skeletal abnormality. Upper chest: Negative Other: None IMPRESSION: Enlargement of the left submandibular gland with surrounding edema. No abscess. 3 x 5 mm obstructing stone in the left submandibular duct, largely obscured by artifact from dental implants. Edema extends into the left lateral larynx and left vallecula without airway compromise. Electronically Signed   By: Franchot Gallo M.D.   On: 03/27/2018 12:04    ROS: otherwise negative  There were no vitals taken for this visit.  PHYSICAL EXAM: Overall appearance:  Healthy appearing, in no distress Head:  Normocephalic, atraumatic. Ears: External auditory canals are clear; tympanic membranes are intact and the middle ears are free of any effusion.  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 Nose: External nose is healthy in appearance. Internal nasal exam free of any lesions or obstruction. Oral Cavity/pharynx:  There are no mucosal lesions or masses identified.  There is mild swelling of the floor of mouth on the left.  The duct is palpably dilated.  A stone is not palpable.  She has dental implants which make it a little bit difficult to examine the floor of mouth. Neuro:  No identifiable neurologic deficits. Neck: Tender swelling of the left submandibular gland.  Studies Reviewed: CT neck.    Assessment/Plan Submandibular sialoadenitis, secondary to calculus.  Recommend  transoral exploration and attempted removal of the stone.  Risks and benefits were discussed in detail.  All questions were answered.  Izora Gala 03/28/2018, 8:48 AM

## 2018-03-28 NOTE — Discharge Instructions (Signed)
Stay well-hydrated.  Massage the gland several times daily.  Rinse mouth with salt water several times daily.

## 2018-03-28 NOTE — Anesthesia Procedure Notes (Signed)
Procedure Name: Intubation Date/Time: 03/28/2018 12:11 PM Performed by: White, Amedeo Plenty, CRNA Pre-anesthesia Checklist: Patient identified, Emergency Drugs available, Suction available and Patient being monitored Patient Re-evaluated:Patient Re-evaluated prior to induction Oxygen Delivery Method: Circle System Utilized Preoxygenation: Pre-oxygenation with 100% oxygen Induction Type: IV induction and Rapid sequence Ventilation: Mask ventilation without difficulty Laryngoscope Size: Mac and 3 Grade View: Grade I Tube type: Oral Tube size: 7.0 mm Number of attempts: 1 Airway Equipment and Method: Stylet Placement Confirmation: ETT inserted through vocal cords under direct vision,  positive ETCO2 and breath sounds checked- equal and bilateral Secured at: 22 cm Tube secured with: Tape Dental Injury: Teeth and Oropharynx as per pre-operative assessment

## 2018-03-28 NOTE — OR Nursing (Signed)
Dr. Pollyann Kennedy signed the consent for the procedure at 08:48 am.

## 2018-03-28 NOTE — Anesthesia Preprocedure Evaluation (Addendum)
Anesthesia Evaluation  Patient identified by MRN, date of birth, ID band Patient awake    Reviewed: Allergy & Precautions, NPO status , Patient's Chart, lab work & pertinent test results  Airway Mallampati: III  TM Distance: <3 FB Neck ROM: Full    Dental no notable dental hx.    Pulmonary neg pulmonary ROS,    Pulmonary exam normal breath sounds clear to auscultation       Cardiovascular hypertension, Normal cardiovascular exam Rhythm:Regular Rate:Normal     Neuro/Psych negative neurological ROS  negative psych ROS   GI/Hepatic Neg liver ROS, GERD  ,  Endo/Other  negative endocrine ROS  Renal/GU negative Renal ROS  negative genitourinary   Musculoskeletal negative musculoskeletal ROS (+)   Abdominal   Peds negative pediatric ROS (+)  Hematology negative hematology ROS (+)   Anesthesia Other Findings   Reproductive/Obstetrics negative OB ROS                            Anesthesia Physical Anesthesia Plan  ASA: II  Anesthesia Plan: General   Post-op Pain Management:    Induction: Intravenous and Rapid sequence  PONV Risk Score and Plan: 2 and Ondansetron, Dexamethasone and Treatment may vary due to age or medical condition  Airway Management Planned: Oral ETT  Additional Equipment:   Intra-op Plan:   Post-operative Plan: Extubation in OR  Informed Consent: I have reviewed the patients History and Physical, chart, labs and discussed the procedure including the risks, benefits and alternatives for the proposed anesthesia with the patient or authorized representative who has indicated his/her understanding and acceptance.   Dental advisory given  Plan Discussed with: CRNA and Surgeon  Anesthesia Plan Comments:         Anesthesia Quick Evaluation

## 2018-03-28 NOTE — Anesthesia Postprocedure Evaluation (Signed)
Anesthesia Post Note  Patient: Jennifer Novak  Procedure(s) Performed: TRANSORAL SALIVARY STONE REMOVAL/SIALODUCOPLASTY (N/A Mouth)     Patient location during evaluation: PACU Anesthesia Type: General Level of consciousness: awake and alert Pain management: pain level controlled Vital Signs Assessment: post-procedure vital signs reviewed and stable Respiratory status: spontaneous breathing, nonlabored ventilation, respiratory function stable and patient connected to nasal cannula oxygen Cardiovascular status: blood pressure returned to baseline and stable Postop Assessment: no apparent nausea or vomiting Anesthetic complications: no    Last Vitals:  Vitals:   03/28/18 1315 03/28/18 1330  BP: (!) 164/81 (!) 163/82  Pulse: 71 84  Resp: 15 16  Temp:    SpO2: 99% 97%    Last Pain:  Vitals:   03/28/18 1315  TempSrc:   PainSc: Asleep                 Jelesa Mangini S

## 2018-03-29 ENCOUNTER — Encounter (HOSPITAL_COMMUNITY): Payer: Self-pay | Admitting: Otolaryngology

## 2018-05-08 ENCOUNTER — Encounter: Payer: Self-pay | Admitting: *Deleted

## 2018-05-08 ENCOUNTER — Emergency Department (INDEPENDENT_AMBULATORY_CARE_PROVIDER_SITE_OTHER)
Admission: EM | Admit: 2018-05-08 | Discharge: 2018-05-08 | Disposition: A | Discharge status: Home / Self Care | Payer: Medicare Other | Source: Home / Self Care | Attending: Family Medicine | Admitting: Family Medicine

## 2018-05-08 DIAGNOSIS — K1379 Other lesions of oral mucosa: Secondary | ICD-10-CM | POA: Diagnosis not present

## 2018-05-08 MED ORDER — CHLORHEXIDINE GLUCONATE 0.12% ORAL RINSE (MEDLINE KIT): OROMUCOSAL | 0 refills | Status: DC

## 2018-05-08 NOTE — ED Provider Notes (Signed)
[name]    CSN: [id number] Arrival date & time: 05/08/18  1155     History   Chief Complaint Chief Complaint  Patient presents with  . Oral Swelling    HPI Jennifer Novak is a 77 y.o. female.   HPI  Jennifer Novak is a 77 y.o. female presenting to UC with c/o a possible open sore in the left side of her tongue along with mouth swelling.  She had surgery on the Left side floor of her mouth about 6 weeks ago to remove a salivary stone. They did not find the stone during surgery but a few days later pt states the stone did come out.  Swelling has never gone away completely and she still has some soreness and a bad taste in her mouth.  She has not f/u with the surgeon because she could not find his name or a phone number on her paperwork.   Pt medical records, pt was seen and treated by Dr. Izora Gala, ENT.    Pt denies fever, chills, n/v/d. No difficulty swallowing food or liquids. She was not placed on antibiotics after the procedure.    Past Medical History:  Diagnosis Date  . GERD (gastroesophageal reflux disease)   . Hypertension     There are no active problems to display for this patient.   Past Surgical History:  Procedure Laterality Date  . ABDOMINAL HYSTERECTOMY    . FOOT SURGERY Left    strightened two toes  . KNEE ARTHROPLASTY Bilateral   . ROTATOR CUFF REPAIR Right   . SALIVARY STONE REMOVAL N/A 03/28/2018   Procedure: TRANSORAL SALIVARY STONE REMOVAL/SIALODUCOPLASTY;  Surgeon: Izora Gala, MD;  Location: [facility];  Service: ENT;  Laterality: N/A;    OB History   None      Home Medications    Prior to Admission medications   Medication Sig Start Date End Date Taking? Authorizing Provider  chlorhexidine gluconate, MEDLINE KIT, (PERIDEX) 0.12 % solution Swish 10-57m liquid in mouth for 30 seconds and spit, twice daily after tooth brushing for 1 week 05/08/18   PNoe Gens PA-C  cholecalciferol (VITAMIN D) 1000 units tablet Take 1,000  Units by mouth daily.    [provider]  Cyanocobalamin (VITAMIN B-12) 1000 MCG SUBL Place 1,000 mcg under the tongue daily.    [provider]  diclofenac sodium (VOLTAREN) 1 % GEL Apply 1 application topically as needed for pain. 09/13/16   [provider]  estradiol (CLIMARA - DOSED IN MG/24 HR) 0.1 mg/24hr patch Place 1 patch onto the skin once a week.     [provider]  hydrochlorothiazide (HYDRODIURIL) 25 MG tablet Take 25 mg by mouth daily. 03/11/18   [provider]  HYDROcodone-acetaminophen (NORCO) 7.5-325 MG tablet Take 1 tablet by mouth every 6 (six) hours as needed for moderate pain. 03/28/18   RIzora Gala MD  ibuprofen (ADVIL,MOTRIN) 200 MG tablet Take 200 mg by mouth every 6 (six) hours as needed for pain.    [provider]  pantoprazole (PROTONIX) 20 MG tablet Take 20 mg by mouth daily.    [provider]  promethazine (PHENERGAN) 25 MG suppository Place 1 suppository (25 mg total) rectally every 6 (six) hours as needed for nausea or vomiting. 03/28/18   RIzora Gala MD  traMADol (ULTRAM) 50 MG tablet Take 50 mg by mouth daily. for pain 03/11/18   [provider]    Family History History reviewed. No pertinent  family history.  Social History Social History   Tobacco Use  . Smoking status: Passive Smoke Exposure - Never Smoker  . Smokeless tobacco: Never Used  Substance Use Topics  . Alcohol use: Yes    Comment: Wine once in a while  . Drug use: Never     Allergies   Penicillins; Clindamycin/lincomycin; Codeine; Hydrocodone; and Sulfa antibiotics   Review of Systems Review of Systems  Constitutional: Negative for chills and fever.  HENT: Positive for dental problem and mouth sores.   Gastrointestinal: Negative for diarrhea, nausea and vomiting.     Physical Exam Triage Vital Signs ED Triage Vitals  Enc Vitals Group     BP 05/08/18 1217 (!) 175/91     Pulse Rate 05/08/18 1217 76      Resp 05/08/18 1217 14     Temp 05/08/18 1217 98.3 F (36.8 C)     Temp Source 05/08/18 1217 Oral     SpO2 05/08/18 1217 99 %     Weight 05/08/18 1218 209 lb (94.8 kg)     Height --      Head Circumference --      Peak Flow --      Pain Score 05/08/18 1218 4     Pain Loc --      Pain Edu? --      Excl. in [place]? --    No data found.  Updated Vital Signs BP (!) 175/91 (BP Location: Right Arm)   Pulse 76   Temp 98.3 F (36.8 C) (Oral)   Resp 14   Wt 209 lb (94.8 kg)   SpO2 99%   BMI 31.78 kg/m   Visual Acuity Right Eye Distance:   Left Eye Distance:   Bilateral Distance:    Right Eye Near:   Left Eye Near:    Bilateral Near:     Physical Exam  Constitutional: She is oriented to person, place, and time. She appears well-developed and well-nourished.  HENT:  Head: Normocephalic and atraumatic.  Mouth/Throat: Uvula is midline, oropharynx is clear and moist and mucous membranes are normal. Oral lesions present.    Mild edema on Left side of lower mouth. No tenderness to gingiva or jaw. No trismus. Tongue: small area of engorged appearing veins. Skin in tact. Anteriorlateral tongue: 35m ulceration. No bleeding or drainage.   Eyes: EOM are normal.  Neck: Normal range of motion.  Cardiovascular: Normal rate.  Pulmonary/Chest: Effort normal.  Musculoskeletal: Normal range of motion.  Neurological: She is alert and oriented to person, place, and time.  Skin: Skin is warm and dry.  Psychiatric: She has a normal mood and affect. Her behavior is normal.  Nursing note and vitals reviewed.    UC Treatments / Results  Labs (all labs ordered are listed, but only abnormal results are displayed) Labs Reviewed - No data to display  EKG None  Radiology No results found.  Procedures Procedures (including critical care time)  Medications Ordered in UC Medications - No data to display  Initial Impression / Assessment and Plan / UC Course  I have reviewed the triage vital  signs and the nursing notes.  Pertinent labs & imaging results that were available during my care of the patient were reviewed by me and considered in my medical decision making (see chart for details).     Hx and exam most c/w a canker sore. No evidence of underlying cellulitis or gingival abscess. Will have pt try chlorhexidine mouthwash. Home care instructions provided  below.   Final Clinical Impressions(s) / UC Diagnoses   Final diagnoses:  Mouth sore     Discharge Instructions      You may try the prescribed mouthwash to see if this helps with your symptoms. If you are not improving in 4-5 days, symptoms worsening, or new symptoms develop, please follow up with your Ear Nose and Throat (ENT) surgeon or your dentist for further evaluation and treatment.     ED Prescriptions    Medication Sig Dispense Auth. Provider   chlorhexidine gluconate, MEDLINE KIT, (PERIDEX) 0.12 % solution Swish 10-65m liquid in mouth for 30 seconds and spit, twice daily after tooth brushing for 1 week 120 mL PNoe Gens PA-C     Controlled Substance Prescriptions [place] Controlled Substance Registry consulted? Not Applicable   PTyrell Antonio07/05/19 1331

## 2018-05-08 NOTE — ED Triage Notes (Signed)
Patient had salivary stone surgically removed about 6 weeks ago. Since she has felt some oral swelling. Today noted soreness and an "open looking" sore to the left side of her tongue.

## 2018-05-08 NOTE — Discharge Instructions (Signed)
°  You may try the prescribed mouthwash to see if this helps with your symptoms. If you are not improving in 4-5 days, symptoms worsening, or new symptoms develop, please follow up with your Ear Nose and Throat (ENT) surgeon or your dentist for further evaluation and treatment.

## 2018-05-10 ENCOUNTER — Telehealth: Payer: Self-pay

## 2018-05-10 NOTE — Telephone Encounter (Signed)
Pt says mouth sore is clearing up after using the rx'd mouthwash. Advised to call should she have any questions or concerns.

## 2019-01-19 ENCOUNTER — Other Ambulatory Visit: Payer: Self-pay

## 2019-01-19 ENCOUNTER — Emergency Department (INDEPENDENT_AMBULATORY_CARE_PROVIDER_SITE_OTHER)
Admission: EM | Admit: 2019-01-19 | Discharge: 2019-01-19 | Disposition: A | Discharge status: Home / Self Care | Payer: Medicare Other | Source: Home / Self Care

## 2019-01-19 DIAGNOSIS — S01511D Laceration without foreign body of lip, subsequent encounter: Secondary | ICD-10-CM | POA: Diagnosis not present

## 2019-01-19 DIAGNOSIS — Z4802 Encounter for removal of sutures: Secondary | ICD-10-CM

## 2019-01-19 NOTE — ED Provider Notes (Signed)
[name]    CSN: [id number] Arrival date & time: 01/19/19  0806     History   Chief Complaint Chief Complaint  Patient presents with  . Suture / Staple Removal    HPI Jennifer Novak is a 78 y.o. female.   HPI  Jennifer Novak is a 78 y.o. female presenting to UC with request for suture removal from her upper lip. Pt reports tripping and falling onto her face on 01/13/2019. She was seen at [facility] and had 2 dissolvable sutures placed inside her upper lip and 1 nylon suture placed on the outside. It has scabbed some but pt denies pain. No fever. She has been applying neosporin. No other concerns today.   Past Medical History:  Diagnosis Date  . GERD (gastroesophageal reflux disease)   . Hypertension     There are no active problems to display for this patient.   Past Surgical History:  Procedure Laterality Date  . ABDOMINAL HYSTERECTOMY    . FOOT SURGERY Left    strightened two toes  . KNEE ARTHROPLASTY Bilateral   . ROTATOR CUFF REPAIR Right   . SALIVARY STONE REMOVAL N/A 03/28/2018   Procedure: TRANSORAL SALIVARY STONE REMOVAL/SIALODUCOPLASTY;  Surgeon: Izora Gala, MD;  Location: [facility];  Service: ENT;  Laterality: N/A;    OB History   No obstetric history on file.      Home Medications    Prior to Admission medications   Medication Sig Start Date End Date Taking? Authorizing Provider  losartan (COZAAR) 50 MG tablet TAKE 1 TABLET(50 MG) BY MOUTH DAILY FOR HIGH BLOOD PRESSURE 10/05/18  Yes [provider]  chlorhexidine gluconate, MEDLINE KIT, (PERIDEX) 0.12 % solution Swish 10-31m liquid in mouth for 30 seconds and spit, twice daily after tooth brushing for 1 week 05/08/18   PNoe Gens PA-C  cholecalciferol (VITAMIN D) 1000 units tablet Take 1,000 Units by mouth daily.    [provider]  Cyanocobalamin (VITAMIN B-12) 1000 MCG SUBL Place 1,000 mcg under the tongue daily.    [provider]  diclofenac sodium (VOLTAREN) 1  % GEL Apply 1 application topically as needed for pain. 09/13/16   [provider]  estradiol (CLIMARA - DOSED IN MG/24 HR) 0.1 mg/24hr patch Place 1 patch onto the skin once a week.     [provider]  hydrochlorothiazide (HYDRODIURIL) 25 MG tablet Take 25 mg by mouth daily. 03/11/18   [provider]  HYDROcodone-acetaminophen (NORCO) 7.5-325 MG tablet Take 1 tablet by mouth every 6 (six) hours as needed for moderate pain. 03/28/18   RIzora Gala MD  ibuprofen (ADVIL,MOTRIN) 200 MG tablet Take 200 mg by mouth every 6 (six) hours as needed for pain.    [provider]  pantoprazole (PROTONIX) 20 MG tablet Take 20 mg by mouth daily.    [provider]  promethazine (PHENERGAN) 25 MG suppository Place 1 suppository (25 mg total) rectally every 6 (six) hours as needed for nausea or vomiting. 03/28/18   RIzora Gala MD  traMADol (ULTRAM) 50 MG tablet Take 50 mg by mouth daily. for pain 03/11/18   [provider]    Family History History reviewed. No pertinent family history.  Social History Social History   Tobacco Use  . Smoking status: Passive Smoke Exposure - Never Smoker  . Smokeless tobacco: Never Used  Substance Use Topics  . Alcohol use: Yes    Comment: Wine once in a while  . Drug use:  Never     Allergies   Penicillins; Clindamycin/lincomycin; Codeine; Hydrocodone; and Sulfa antibiotics   Review of Systems Review of Systems  Skin: Positive for wound. Negative for color change.  Neurological: Negative for dizziness and headaches.     Physical Exam Triage Vital Signs ED Triage Vitals [01/19/19 0825]  Enc Vitals Group     BP 131/80     Pulse Rate 82     Resp 18     Temp 97.7 F (36.5 C)     Temp Source Oral     SpO2 98 %     Weight 211 lb 10.3 oz (96 kg)     Height '5\' 8"'  (1.727 m)     Head Circumference      Peak Flow      Pain Score 0     Pain Loc      Pain Edu?      Excl. in [place]?    No data found.   Updated Vital Signs BP 131/80 (BP Location: Right Arm)   Pulse 82   Temp 97.7 F (36.5 C) (Oral)   Resp 18   Ht '5\' 8"'  (1.727 m)   Wt 211 lb 10.3 oz (96 kg)   SpO2 98%   BMI 32.18 kg/m   Visual Acuity Right Eye Distance:   Left Eye Distance:   Bilateral Distance:    Right Eye Near:   Left Eye Near:    Bilateral Near:     Physical Exam Vitals signs and nursing note reviewed.  Constitutional:      Appearance: Normal appearance. She is well-developed.  HENT:     Head: Normocephalic.     Mouth/Throat:      Comments: One interrupted suture in place. Scant amount crusty discharge. Non-tender.  Two white dissolvable sutures in mucosal side of upper lip. No erythema or discharge.  Neck:     Musculoskeletal: Normal range of motion.  Cardiovascular:     Rate and Rhythm: Normal rate.  Pulmonary:     Effort: Pulmonary effort is normal.  Musculoskeletal: Normal range of motion.  Skin:    General: Skin is warm and dry.  Neurological:     Mental Status: She is alert and oriented to person, place, and time.  Psychiatric:        Behavior: Behavior normal.      UC Treatments / Results  Labs (all labs ordered are listed, but only abnormal results are displayed) Labs Reviewed - No data to display  EKG None  Radiology No results found.  Procedures Procedures (including critical care time)  Medications Ordered in UC Medications - No data to display  Initial Impression / Assessment and Plan / UC Course  I have reviewed the triage vital signs and the nursing notes.  Pertinent labs & imaging results that were available during my care of the patient were reviewed by me and considered in my medical decision making (see chart for details).     Upper lip cleaned with saline. One suture removed w/o complication. Mucosal sutures look normal. No evidence of infection. Sutures kept in place. F/u as needed  Final Clinical Impressions(s) / UC Diagnoses   Final diagnoses:   Visit for suture removal   Discharge Instructions   None    ED Prescriptions    None     Controlled Substance Prescriptions [place] Controlled Substance Registry consulted? Not Applicable   Tyrell Antonio 01/19/19 7510

## 2019-01-19 NOTE — ED Triage Notes (Signed)
Pt here today to have a stitch removed from her upper lip. Larey Seat 01/13/19 causing a laceration to upper lip.

## 2019-11-02 ENCOUNTER — Emergency Department (INDEPENDENT_AMBULATORY_CARE_PROVIDER_SITE_OTHER)
Admission: EM | Admit: 2019-11-02 | Discharge: 2019-11-02 | Disposition: A | Discharge status: Home / Self Care | Payer: Medicare Other | Source: Home / Self Care

## 2019-11-02 ENCOUNTER — Other Ambulatory Visit: Payer: Self-pay

## 2019-11-02 DIAGNOSIS — M7661 Achilles tendinitis, right leg: Secondary | ICD-10-CM | POA: Diagnosis not present

## 2019-11-02 MED ORDER — PREDNISONE 20 MG PO TABS: ORAL_TABLET | ORAL | 1 refills | Status: DC

## 2019-11-02 NOTE — ED Provider Notes (Signed)
[name]    CSN: [id number] Arrival date & time: 11/02/19  8527      History   Chief Complaint Chief Complaint  Patient presents with  . Foot Pain    HPI Jennifer Novak is a 78 y.o. female.   Is a 78 year old established [facility] urgent [facility] patient.  She has developed Swelling and pain in the right achilles started last Tuesday or Wednesday.  No injury per patient   Pain began about a week ago during a busy week involving more walking.  She had a similar problem 30 years ago during a trip to [place] where she was active.  No h/o gout     Past Medical History:  Diagnosis Date  . GERD (gastroesophageal reflux disease)   . Hypertension     There are no problems to display for this patient.   Past Surgical History:  Procedure Laterality Date  . ABDOMINAL HYSTERECTOMY    . FOOT SURGERY Left    strightened two toes  . KNEE ARTHROPLASTY Bilateral   . ROTATOR CUFF REPAIR Right   . SALIVARY STONE REMOVAL N/A 03/28/2018   Procedure: TRANSORAL SALIVARY STONE REMOVAL/SIALODUCOPLASTY;  Surgeon: Izora Gala, MD;  Location: [facility];  Service: ENT;  Laterality: N/A;    OB History   No obstetric history on file.      Home Medications    Prior to Admission medications   Medication Sig Start Date End Date Taking? Authorizing Provider  chlorhexidine gluconate, MEDLINE KIT, (PERIDEX) 0.12 % solution Swish 10-47m liquid in mouth for 30 seconds and spit, twice daily after tooth brushing for 1 week 05/08/18   PNoe Gens PA-C  cholecalciferol (VITAMIN D) 1000 units tablet Take 1,000 Units by mouth daily.    [provider]  Cyanocobalamin (VITAMIN B-12) 1000 MCG SUBL Place 1,000 mcg under the tongue daily.    [provider]  diclofenac sodium (VOLTAREN) 1 % GEL Apply 1 application topically as needed for pain. 09/13/16   [provider]  estradiol (CLIMARA - DOSED IN MG/24 HR) 0.1 mg/24hr patch Place 1 patch onto the skin once a  week.     [provider]  hydrochlorothiazide (HYDRODIURIL) 25 MG tablet Take 25 mg by mouth daily. 03/11/18   [provider]  HYDROcodone-acetaminophen (NORCO) 7.5-325 MG tablet Take 1 tablet by mouth every 6 (six) hours as needed for moderate pain. 03/28/18   RIzora Gala MD  ibuprofen (ADVIL,MOTRIN) 200 MG tablet Take 200 mg by mouth every 6 (six) hours as needed for pain.    [provider]  losartan (COZAAR) 50 MG tablet TAKE 1 TABLET(50 MG) BY MOUTH DAILY FOR HIGH BLOOD PRESSURE 10/05/18   [provider]  pantoprazole (PROTONIX) 20 MG tablet Take 20 mg by mouth daily.    [provider]  predniSONE (DELTASONE) 20 MG tablet 2 daily with food 11/02/19   LRobyn Haber MD  promethazine (PHENERGAN) 25 MG suppository Place 1 suppository (25 mg total) rectally every 6 (six) hours as needed for nausea or vomiting. 03/28/18   RIzora Gala MD  terbinafine (LAMISIL) 250 MG tablet Take 250 mg by mouth daily. 10/30/19   [provider]  traMADol (ULTRAM) 50 MG tablet Take 50 mg by mouth daily. for pain 03/11/18   [provider]    Family History History reviewed. No pertinent family history.  Social History Social History   Tobacco Use  . Smoking status: Passive Smoke Exposure - Never Smoker  .  Smokeless tobacco: Never Used  Substance Use Topics  . Alcohol use: Yes    Comment: Wine once in a while  . Drug use: Never     Allergies   Penicillins, Clindamycin/lincomycin, Codeine, Eggs or egg-derived products, Hydrocodone, and Sulfa antibiotics   Review of Systems Review of Systems  Constitutional: Negative.   Musculoskeletal: Positive for gait problem and joint swelling.     Physical Exam Triage Vital Signs ED Triage Vitals [11/02/19 0901]  Enc Vitals Group     BP      Pulse      Resp      Temp      Temp src      SpO2      Weight 208 lb (94.3 kg)     Height _0  (1.727 m)     Head Circumference      Peak Flow       Pain Score 9     Pain Loc      Pain Edu?      Excl. in [place]?    No data found.  Updated Vital Signs BP 119/79 (BP Location: Right Arm)   Pulse 83   Temp 97.6 F (36.4 C) (Oral)   Resp 20   Ht _1  (1.727 m)   Wt 94.3 kg   SpO2 100%   BMI 31.63 kg/m    Physical Exam Vitals and nursing note reviewed.  Constitutional:      General: She is not in acute distress.    Appearance: Normal appearance. She is normal weight.  Eyes:     Conjunctiva/sclera: Conjunctivae normal.  Cardiovascular:     Rate and Rhythm: Normal rate.  Pulmonary:     Effort: Pulmonary effort is normal.  Musculoskeletal:        General: Swelling present. No tenderness, deformity or signs of injury.     Cervical back: Normal range of motion and neck supple.     Comments: Tender, swollen right achilles area with discoloration  Skin:    General: Skin is warm and dry.     Findings: Bruising present.  Neurological:     General: No focal deficit present.     Mental Status: She is alert.  Psychiatric:        Mood and Affect: Mood normal.      UC Treatments / Results  Labs (all labs ordered are listed, but only abnormal results are displayed) Labs Reviewed - No data to display  EKG   Radiology No results found.  Procedures Procedures (including critical care time)  Medications Ordered in UC Medications - No data to display  Initial Impression / Assessment and Plan / UC Course  I have reviewed the triage vital signs and the nursing notes.  Pertinent labs & imaging results that were available during my care of the patient were reviewed by me and considered in my medical decision making (see chart for details).    Final Clinical Impressions(s) / UC Diagnoses   Final diagnoses:  Achilles bursitis of right lower extremity   Discharge Instructions   None    ED Prescriptions    Medication Sig Dispense Auth. Provider   predniSONE (DELTASONE) 20 MG tablet 2 daily with food 10 tablet  Robyn Haber, MD     I have reviewed the PDMP during this encounter.   Robyn Haber, MD 11/02/19 [phone]

## 2019-11-02 NOTE — ED Triage Notes (Signed)
Swelling and pain in the right foot started last Tuesday or Wednesday.  No injury per patient

## 2021-08-27 ENCOUNTER — Emergency Department (INDEPENDENT_AMBULATORY_CARE_PROVIDER_SITE_OTHER): Payer: Medicare Other

## 2021-08-27 ENCOUNTER — Other Ambulatory Visit: Payer: Self-pay

## 2021-08-27 ENCOUNTER — Emergency Department (INDEPENDENT_AMBULATORY_CARE_PROVIDER_SITE_OTHER)
Admission: EM | Admit: 2021-08-27 | Discharge: 2021-08-27 | Disposition: A | Discharge status: Home / Self Care | Payer: Medicare Other | Source: Home / Self Care | Attending: Family Medicine | Admitting: Family Medicine

## 2021-08-27 DIAGNOSIS — M25521 Pain in right elbow: Secondary | ICD-10-CM | POA: Diagnosis not present

## 2021-08-27 DIAGNOSIS — M7021 Olecranon bursitis, right elbow: Secondary | ICD-10-CM

## 2021-08-27 MED ORDER — PREDNISONE 20 MG PO TABS: ORAL_TABLET | ORAL | 0 refills | Status: DC

## 2021-08-27 NOTE — ED Triage Notes (Signed)
Pt c/o RT elbow pain x 6-8 weeks. Had RT knee surgery in April, has been using a cane and not sure if shes aggravated her elbow by putting too much pressure on the cane. Pain 8/10 Tylenol prn.

## 2021-08-27 NOTE — Discharge Instructions (Addendum)
Wear ace wrap applied lightly.  Apply ice pack for 20 to 30 minutes, 3 to 4 times daily  Continue until pain and swelling decrease.  May take Tylenol 500mg , two tabs at bedtime for pain. After finishing prednisone may apply Voltaren gel once or twice daily if needed.

## 2021-08-27 NOTE — ED Provider Notes (Signed)
[name]    CSN: [id number] Arrival date & time: 08/27/21  1004      History   Chief Complaint Chief Complaint  Patient presents with   Elbow Pain    RT    HPI Jennifer Novak is a 80 y.o. female.   Patient complains of pain in her right posterior elbow for about 6 to 8 weeks.  She recalls no injury.  However, she had right knee surgery in April and has been using a cane in her right hand to ambulate.  She believes that she has irritated her right elbow by using a cane consistently.  The history is provided by the patient and a relative.   Past Medical History:  Diagnosis Date   GERD (gastroesophageal reflux disease)    Hypertension     There are no problems to display for this patient.   Past Surgical History:  Procedure Laterality Date   ABDOMINAL HYSTERECTOMY     FOOT SURGERY Left    strightened two toes   KNEE ARTHROPLASTY Bilateral    ROTATOR CUFF REPAIR Right    SALIVARY STONE REMOVAL N/A 03/28/2018   Procedure: TRANSORAL SALIVARY STONE REMOVAL/SIALODUCOPLASTY;  Surgeon: Izora Gala, MD;  Location: [facility];  Service: ENT;  Laterality: N/A;    OB History   No obstetric history on file.      Home Medications    Prior to Admission medications   Medication Sig Start Date End Date Taking? Authorizing Provider  chlorhexidine gluconate, MEDLINE KIT, (PERIDEX) 0.12 % solution Swish 10-77mL liquid in mouth for 30 seconds and spit, twice daily after tooth brushing for 1 week 05/08/18   Noe Gens, PA-C  cholecalciferol (VITAMIN D) 1000 units tablet Take 1,000 Units by mouth daily.    [provider]  Cyanocobalamin (VITAMIN B-12) 1000 MCG SUBL Place 1,000 mcg under the tongue daily.    [provider]  diclofenac sodium (VOLTAREN) 1 % GEL Apply 1 application topically as needed for pain. 09/13/16   [provider]  estradiol (CLIMARA - DOSED IN MG/24 HR) 0.1 mg/24hr patch Place 1 patch onto the skin once a week.      [provider]  hydrochlorothiazide (HYDRODIURIL) 25 MG tablet Take 25 mg by mouth daily. 03/11/18   [provider]  HYDROcodone-acetaminophen (NORCO) 7.5-325 MG tablet Take 1 tablet by mouth every 6 (six) hours as needed for moderate pain. 03/28/18   Izora Gala, MD  ibuprofen (ADVIL,MOTRIN) 200 MG tablet Take 200 mg by mouth every 6 (six) hours as needed for pain.    [provider]  losartan (COZAAR) 50 MG tablet TAKE 1 TABLET(50 MG) BY MOUTH DAILY FOR HIGH BLOOD PRESSURE 10/05/18   [provider]  pantoprazole (PROTONIX) 20 MG tablet Take 20 mg by mouth daily.    [provider]  predniSONE (DELTASONE) 20 MG tablet 2 daily with food 08/27/21   Kandra Nicolas, MD  promethazine (PHENERGAN) 25 MG suppository Place 1 suppository (25 mg total) rectally every 6 (six) hours as needed for nausea or vomiting. 03/28/18   Izora Gala, MD  terbinafine (LAMISIL) 250 MG tablet Take 250 mg by mouth daily. 10/30/19   [provider]  traMADol (ULTRAM) 50 MG tablet Take 50 mg by mouth daily. for pain 03/11/18   [provider]    Family History History reviewed. No pertinent family history.  Social History Social History   Tobacco Use   Smoking status: Passive Smoke Exposure -  Never Smoker   Smokeless tobacco: Never  Vaping Use   Vaping Use: Never used  Substance Use Topics   Alcohol use: Yes    Comment: Wine once in a while   Drug use: Never     Allergies   Penicillins, Clindamycin/lincomycin, Codeine, Eggs or egg-derived products, Hydrocodone, and Sulfa antibiotics   Review of Systems Review of Systems  Constitutional:  Negative for activity change, chills, diaphoresis and fatigue.  Endocrine: Negative.   Musculoskeletal:  Negative for joint swelling.       Right elbow pain  All other systems reviewed and are negative.   Physical Exam Triage Vital Signs ED Triage Vitals  Enc Vitals Group     BP 08/27/21 1021 116/77      Pulse Rate 08/27/21 1021 81     Resp 08/27/21 1021 17     Temp 08/27/21 1021 98.2 F (36.8 C)     Temp Source 08/27/21 1021 Oral     SpO2 08/27/21 1021 100 %     Weight --      Height --      Head Circumference --      Peak Flow --      Pain Score 08/27/21 1017 8     Pain Loc --      Pain Edu? --      Excl. in [place]? --    No data found.  Updated Vital Signs BP 116/77 (BP Location: Left Arm)   Pulse 81   Temp 98.2 F (36.8 C) (Oral)   Resp 17   SpO2 100%   Visual Acuity Right Eye Distance:   Left Eye Distance:   Bilateral Distance:    Right Eye Near:   Left Eye Near:    Bilateral Near:     Physical Exam Vitals and nursing note reviewed.  Constitutional:      General: She is not in acute distress. HENT:     Head: Normocephalic.  Eyes:     Pupils: Pupils are equal, round, and reactive to light.  Cardiovascular:     Rate and Rhythm: Normal rate.  Pulmonary:     Effort: Pulmonary effort is normal.  Musculoskeletal:     Right elbow: No swelling, deformity or effusion. Normal range of motion. Tenderness present in olecranon process.       Arms:     Comments: Right elbow has full range of motion.  There is no swelling, erythema, or warmth.  There is tenderness over the proximal olecranon bursa.  Skin:    General: Skin is warm and dry.     Findings: No rash.  Neurological:     Mental Status: She is alert.     UC Treatments / Results  Labs (all labs ordered are listed, but only abnormal results are displayed) Labs Reviewed - No data to display  EKG   Radiology DG Elbow Complete Right  Result Date: 08/27/2021 CLINICAL DATA:  Pain in the olecranon process EXAM: RIGHT ELBOW - COMPLETE 3+ VIEW COMPARISON:  None. FINDINGS: There is no evidence of fracture, dislocation, or joint effusion. There is no evidence of arthropathy or other focal bone abnormality. Soft tissues are unremarkable. IMPRESSION: Negative. Electronically Signed   By: Keane Police D.O.   On:  08/27/2021 12:28    Procedures Procedures (including critical care time)  Medications Ordered in UC Medications - No data to display  Initial Impression / Assessment and Plan / UC Course  I have reviewed the triage vital signs  and the nursing notes.  Pertinent labs & imaging results that were available during my care of the patient were reviewed by me and considered in my medical decision making (see chart for details).    Ace wrap applied.  Begin prednisone 44m BID for five days. [facility] if not improving about 4 to 6 weeks.  Final Clinical Impressions(s) / UC Diagnoses   Final diagnoses:  Olecranon bursitis of right elbow     Discharge Instructions      Wear ace wrap applied lightly.  Apply ice pack for 20 to 30 minutes, 3 to 4 times daily  Continue until pain and swelling decrease.  May take Tylenol 5011m two tabs at bedtime for pain. After finishing prednisone may apply Voltaren gel once or twice daily if needed.     ED Prescriptions     Medication Sig Dispense Auth. Provider   predniSONE (DELTASONE) 20 MG tablet 2 daily with food 10 tablet BeKandra NicolasMD         BeKandra NicolasMD 08/29/21 [phone]

## 2021-10-12 ENCOUNTER — Ambulatory Visit (INDEPENDENT_AMBULATORY_CARE_PROVIDER_SITE_OTHER): Payer: Medicare Other

## 2021-10-12 ENCOUNTER — Ambulatory Visit (INDEPENDENT_AMBULATORY_CARE_PROVIDER_SITE_OTHER): Payer: Medicare Other | Admitting: Sports Medicine

## 2021-10-12 ENCOUNTER — Other Ambulatory Visit: Payer: Self-pay

## 2021-10-12 DIAGNOSIS — G8929 Other chronic pain: Secondary | ICD-10-CM | POA: Diagnosis not present

## 2021-10-12 DIAGNOSIS — M25521 Pain in right elbow: Secondary | ICD-10-CM

## 2021-10-12 DIAGNOSIS — M25511 Pain in right shoulder: Secondary | ICD-10-CM

## 2021-10-12 MED ORDER — CELECOXIB 200 MG PO CAPS: ORAL_CAPSULE | ORAL | 2 refills | Status: DC

## 2021-10-12 NOTE — Assessment & Plan Note (Signed)
Long history of pain over the deltoid, worse with abduction, impingement signs on exam, adding x-rays, formal physical therapy. Celebrex. Return to see me in 6 weeks, injection if no better.

## 2021-10-12 NOTE — Addendum Note (Signed)
Addended by: Gaynelle Arabian on: 10/12/2021 10:09 AM   Modules accepted: Orders

## 2021-10-12 NOTE — Progress Notes (Signed)
    Procedures performed today:    None.  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  Brief History, Exam, Impression, and Recommendations:    Chronic right shoulder pain Long history of pain over the deltoid, worse with abduction, impingement signs on exam, adding x-rays, formal physical therapy. Celebrex. Return to see me in 6 weeks, injection if no better.  Chronic elbow pain, right Jennifer Novak also reports increasing pain right elbow, localized at the triceps insertion in the olecranon. She had a knee replacement and has been using the cane on the ipsilateral side as a knee replacement and this is resulted in some of her elbow pain. She is arthritis on x-rays, clinically she has tenderness at the triceps insertion with reproduction of pain with resisted extension of the elbow consistent with a triceps tendinitis. I have educated her to use the cane on the contralateral side of the affected extremity, we will add Celebrex, formal physical therapy, and elbow sleeve. Return to see me in 4 to 6 weeks, elbow joint injection if not better.    ___________________________________________ Ihor Austin. Benjamin Stain, M.D., ABFM., CAQSM. Primary Care and Sports Medicine [facility]  Adjunct Instructor of Family Medicine  [employer]

## 2021-10-12 NOTE — Assessment & Plan Note (Signed)
Jennifer Novak also reports increasing pain right elbow, localized at the triceps insertion in the olecranon. She had a knee replacement and has been using the cane on the ipsilateral side as a knee replacement and this is resulted in some of her elbow pain. She is arthritis on x-rays, clinically she has tenderness at the triceps insertion with reproduction of pain with resisted extension of the elbow consistent with a triceps tendinitis. I have educated her to use the cane on the contralateral side of the affected extremity, we will add Celebrex, formal physical therapy, and elbow sleeve. Return to see me in 4 to 6 weeks, elbow joint injection if not better.

## 2021-10-24 ENCOUNTER — Ambulatory Visit (INDEPENDENT_AMBULATORY_CARE_PROVIDER_SITE_OTHER): Payer: Medicare Other | Admitting: Rehabilitative and Restorative Service Providers"

## 2021-10-24 ENCOUNTER — Encounter: Payer: Self-pay | Admitting: Rehabilitative and Restorative Service Providers"

## 2021-10-24 ENCOUNTER — Other Ambulatory Visit: Payer: Self-pay

## 2021-10-24 DIAGNOSIS — M25511 Pain in right shoulder: Secondary | ICD-10-CM

## 2021-10-24 DIAGNOSIS — R29898 Other symptoms and signs involving the musculoskeletal system: Secondary | ICD-10-CM

## 2021-10-24 DIAGNOSIS — G8929 Other chronic pain: Secondary | ICD-10-CM

## 2021-10-24 DIAGNOSIS — M25521 Pain in right elbow: Secondary | ICD-10-CM | POA: Diagnosis not present

## 2021-10-24 NOTE — Therapy (Signed)
[facility] [place] [address] [place] [address] [place], [place], [address] Phone: [phone]   Fax:  [phone]  Physical Therapy Evaluation  Patient Details  Name: Jennifer Novak MRN: [id number] Date of Birth: 10-16-1941 Referring Provider (PT): Dr Benjamin Stain   Encounter Date: 10/24/2021   PT End of Session - 10/24/21 1251     Visit Number 1    Number of Visits 12    Date for PT Re-Evaluation 12/05/21    PT Start Time 1015    PT Stop Time 1108    PT Time Calculation (min) 53 min    Activity Tolerance Patient tolerated treatment well             Past Medical History:  Diagnosis Date   GERD (gastroesophageal reflux disease)    Hypertension     Past Surgical History:  Procedure Laterality Date   ABDOMINAL HYSTERECTOMY     FOOT SURGERY Left    strightened two toes   KNEE ARTHROPLASTY Bilateral    ROTATOR CUFF REPAIR Right    SALIVARY STONE REMOVAL N/A 03/28/2018   Procedure: TRANSORAL SALIVARY STONE REMOVAL/SIALODUCOPLASTY;  Surgeon: Serena Colonel, MD;  Location: [facility] OR;  Service: ENT;  Laterality: N/A;    There were no vitals filed for this visit.    Subjective Assessment - 10/24/21 1023     Subjective Patient had a knee replacememt 02/20/21. She was walking with a cane during rehab and irritated the elbow using the cane in the Rt hand. She was seen in urgent care and treated with medication. She waited 6 weeks and saw Dr T. The Rt knee is improving. She has weaned from the cane for the most part unless she is going somewhere by herself. She continues to having pain in the Rt elbow and shoulder. She has Rt RCR many years ago. She has had some shoulder pain since 11/21 when she was golfing.    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 Yes    Pain Score 6     Pain Location Elbow    Pain Orientation  Right    Pain Descriptors / Indicators Sore;Aching    Pain Type Chronic pain    Pain Radiating Towards up to shoulder    Pain Onset More than a month ago    Pain Frequency Intermittent    Aggravating Factors  using Rt hand; cooking; washing dishes    Pain Relieving Factors pressing on elbow; meds help some; ice; voltaren                [facility] PT Assessment - 10/24/21 0001       Assessment   Medical Diagnosis Rt elow pain; Rt shoulder pain    Referring Provider (PT) Dr Benjamin Stain    Onset Date/Surgical Date 02/20/21    Hand Dominance Right    Next MD Visit 11/09/21    Prior Therapy for shoulder and knees      Precautions   Precautions None      Restrictions   Weight Bearing Restrictions No      Balance Screen   Has the patient fallen in the past 6 months No    Has the patient had a decrease in activity level because of a fear of falling?  No    Is the patient  reluctant to leave their home because of a fear of falling?  No      Home Tourist information centre manager residence      Prior Function   Level of Independence Independent    Vocation Part time employment;Self employed    Vocation Requirements tax returns seasonally    Leisure cooking; light cleaning; golf until 11/21; travel      Observation/Other Assessments   Focus on Therapeutic Outcomes (FOTO)  36      Sensation   Additional Comments WFL's per pt report      Posture/Postural Control   Posture Comments head forward; shoudlers rounded and elevated; head of the humerus anterior in orientation      AROM   Right Shoulder Flexion 110 Degrees    Right Shoulder ABduction 84 Degrees    Right Shoulder External Rotation 28 Degrees   elbow at side   Left Shoulder Flexion 112 Degrees    Left Shoulder ABduction 105 Degrees    Left Shoulder External Rotation 28 Degrees   elbow at side   Right Elbow Flexion 150    Right Elbow Extension 0    Left Elbow Flexion 153    Left Elbow Extension 0    Right  Forearm Supination 68 Degrees    Left Forearm Supination 74 Degrees    Cervical Flexion 42    Cervical Extension 35    Cervical - Right Side Bend 22    Cervical - Left Side Bend 21    Cervical - Right Rotation 52    Cervical - Left Rotation 45      Strength   Right Shoulder Flexion 4/5    Right Shoulder ABduction 4/5    Right Shoulder External Rotation 4/5    Left Shoulder Flexion 4+/5    Left Shoulder ABduction 4+/5    Left Shoulder External Rotation 4+/5    Right Elbow Flexion 4/5    Right Elbow Extension 4/5    Left Elbow Flexion 4+/5    Left Elbow Extension 4+/5    Right Hand Grip (lbs) 35 with pain in elbow to shoulder    Left Hand Grip (lbs) 35 no pain      Palpation   Palpation comment palpable tightness and pain in the area if Rt truceos insertion/olecranon; Rt posterior shoulder girdle/scapula                        Objective measurements completed on examination: See above findings.       [facility] Adult PT Treatment/Exercise - 10/24/21 0001       Shoulder Exercises: Seated   Other Seated Exercises scap squeeze 10 sec x 5 reps; posterior shoulder rolls x 5      Iontophoresis   Type of Iontophoresis Dexamethasone    Location Rt olecranon/triceps    Dose 1 cc; 80 mAmp dose    Time 8 hours                     PT Education - 10/24/21 1105     Education Details HEP POC myofacial ball release work posture in sitting    Person(s) Educated Patient    Methods Explanation;Demonstration;Tactile cues;Verbal cues;Handout    Comprehension Verbalized understanding;Returned demonstration;Verbal cues required;Tactile cues required                 PT Long Term Goals - 10/24/21 1252       PT LONG TERM GOAL #  1   Title Decrease pain Rt elbow and Rt shoulder allowing patient to return to 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    Time 6    Period Weeks    Status New    Target Date 12/05/21      PT LONG TERM  GOAL #2   Title Decrease palpable tightness and pain in the Rt triceps/olecranon area allowing patient to use Rt UE for functional activities    Time 6    Period Weeks    Status New    Target Date 12/05/21      PT LONG TERM GOAL #3   Title Independent in HEP    Time 6    Period Weeks    Status New    Target Date 12/05/21      PT LONG TERM GOAL #4   Title Improve functional limitation score to 62    Time 6    Period Weeks    Status New    Target Date 12/05/21                    Plan - 10/24/21 1119     Clinical Impression Statement Patient presents with 8 month history of Rt elbow pain which she feels is related to using a cane in her Rt UE following TKA 02/20/21. Patient has pain in the Rt elbow and shoulder on an intermittent basis. She has pain with palpation and resistive testing/AROM. Strength in shoulder is decreased Rt compared to Lt. Patient has poor posture and alignment. She has painon an intermittent basis and dificulty with functional activities. Patient will benefit from PT to address problems identified.    Stability/Clinical Decision Making Stable/Uncomplicated    Clinical Decision Making Low    Rehab Potential Good    PT Frequency 2x / week    PT Duration 6 weeks    PT Treatment/Interventions ADLs/Self Care Home Management;Cryotherapy;Electrical Stimulation;Iontophoresis 4mg /ml Dexamethasone;Moist Heat;Ultrasound;Therapeutic activities;Therapeutic exercise;Neuromuscular re-education;Patient/family education;Manual techniques;Taping;Dry needling    PT Next Visit Plan review HEP; assess response to ionto; manual work Rt posterior shoulder girdle to tirceps/posterior arm to elbow; progress with postural strengthening    PT Home Exercise Plan [email or web address]    Consulted and Agree with Plan of Care Patient             Patient will benefit from skilled therapeutic intervention in order to improve the following deficits and impairments:  Decreased range of motion,  Decreased activity tolerance, Pain, Impaired flexibility, Improper body mechanics, Decreased mobility, Decreased strength, Postural dysfunction  Visit Diagnosis: Pain in right elbow  Chronic right shoulder pain  Other symptoms and signs involving the musculoskeletal system     Problem List Patient Active Problem List   Diagnosis Date Noted   Chronic elbow pain, right 10/12/2021   Chronic right shoulder pain 10/12/2021    Yilin Weedon 14/07/2021, PT, MPH  10/24/2021, 12:57 PM  [facility] [address] [place], [place], [place] Phone: [phone]   Fax:  [phone]  Name: Jennifer Novak MRN: Willa Frater Date of Birth: December 06, 1940

## 2021-10-24 NOTE — Patient Instructions (Signed)
Access Code: [email or web address] URL: [email or web address] Date: 10/24/2021 Prepared by: Corlis Leak  Exercises Seated Scapular Retraction - 2 x daily - 7 x weekly - 1-2 sets - 10 reps - 10 sec hold Standing Backward Shoulder Rolls - 2 x daily - 7 x weekly - 1 sets - 10 reps - 1-2 sec hold  Patient Education [place] Patient Instructions

## 2021-10-24 NOTE — Addendum Note (Signed)
Addended by: Val Riles on: 10/24/2021 12:58 PM   Modules accepted: Orders

## 2021-10-26 ENCOUNTER — Encounter: Payer: Self-pay | Admitting: Rehabilitative and Restorative Service Providers"

## 2021-10-26 ENCOUNTER — Other Ambulatory Visit: Payer: Self-pay

## 2021-10-26 ENCOUNTER — Ambulatory Visit (INDEPENDENT_AMBULATORY_CARE_PROVIDER_SITE_OTHER): Payer: Medicare Other | Admitting: Rehabilitative and Restorative Service Providers"

## 2021-10-26 DIAGNOSIS — R29898 Other symptoms and signs involving the musculoskeletal system: Secondary | ICD-10-CM | POA: Diagnosis not present

## 2021-10-26 DIAGNOSIS — M25511 Pain in right shoulder: Secondary | ICD-10-CM | POA: Diagnosis not present

## 2021-10-26 DIAGNOSIS — G8929 Other chronic pain: Secondary | ICD-10-CM | POA: Diagnosis not present

## 2021-10-26 DIAGNOSIS — M25521 Pain in right elbow: Secondary | ICD-10-CM

## 2021-10-26 NOTE — Therapy (Signed)
[facility] [place] [address] [place] [address] [place], [place], [address] Phone: [phone]   Fax:  [phone]  Physical Therapy Treatment  Patient Details  Name: Jennifer Novak MRN: [id number] Date of Birth: 06/27/1941 Referring Provider (PT): Dr Benjamin Stain   Encounter Date: 10/26/2021   PT End of Session - 10/26/21 1021     Visit Number 2    Number of Visits 12    Date for PT Re-Evaluation 12/05/21    PT Start Time 1019    PT Stop Time 1105    PT Time Calculation (min) 46 min    Activity Tolerance Patient tolerated treatment well             Past Medical History:  Diagnosis Date   GERD (gastroesophageal reflux disease)    Hypertension     Past Surgical History:  Procedure Laterality Date   ABDOMINAL HYSTERECTOMY     FOOT SURGERY Left    strightened two toes   KNEE ARTHROPLASTY Bilateral    ROTATOR CUFF REPAIR Right    SALIVARY STONE REMOVAL N/A 03/28/2018   Procedure: TRANSORAL SALIVARY STONE REMOVAL/SIALODUCOPLASTY;  Surgeon: Serena Colonel, MD;  Location: [facility] OR;  Service: ENT;  Laterality: N/A;    There were no vitals filed for this visit.   Subjective Assessment - 10/26/21 1021     Subjective Elbow felt great while the patch was on but started to hurt about supper time. Elbow is hurting again today    Currently in Pain? Yes    Pain Score 6     Pain Location Elbow    Pain Orientation Right    Pain Descriptors / Indicators Sore;Aching    Pain Type Chronic pain                [facility] PT Assessment - 10/26/21 0001       Assessment   Medical Diagnosis Rt elow pain; Rt shoulder pain    Referring Provider (PT) Dr Benjamin Stain    Onset Date/Surgical Date 02/20/21    Hand Dominance Right    Next MD Visit 11/09/21    Prior Therapy for shoulder and knees                           [facility] Adult PT Treatment/Exercise - 10/26/21 0001       Shoulder Exercises: Supine   Flexion AAROM;Both;10 reps    with cane     Shoulder Exercises: Stretch   Other Shoulder Stretches shoulder flexion supine to stretch through the triceps and lats      Iontophoresis   Type of Iontophoresis Dexamethasone    Location Rt olecranon/distal triceps    Dose 1 cc; 80 mAmp dose    Time 8 hours      Manual Therapy   Manual Therapy Taping;Passive ROM;Soft tissue mobilization    Manual therapy comments performed in Lt sidelying with arm supported.    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    Time 6    Period Weeks    Status New    Target Date 12/05/21      PT LONG TERM GOAL #2   Title Decrease palpable tightness and pain in the Rt triceps/olecranon area allowing patient to use Rt UE for functional activities    Time 6    Period Weeks    Status New    Target Date 12/05/21      PT LONG TERM GOAL #3   Title Independent in HEP    Time 6    Period Weeks    Status New    Target Date 12/05/21      PT LONG TERM GOAL #4   Title Improve functional limitation score to 62    Time 6    Period Weeks    Status New    Target Date 12/05/21                   Plan - 10/26/21 1138     Clinical Impression Statement Patient reports good response to ionto Rt elbow while the patch was in place but then symptoms returned. Worked on manual work/STM Rt triceps to posterior shoulder girdle followed by P to AAROM  Rt shoulder into flexion with pt supine. Continued trial of ionto and added taping.    Rehab Potential Good    PT Frequency 2x / week    PT Duration 6 weeks    PT Treatment/Interventions ADLs/Self Care Home Management;Cryotherapy;Electrical Stimulation;Iontophoresis 4mg /ml Dexamethasone;Moist Heat;Ultrasound;Therapeutic activities;Therapeutic exercise;Neuromuscular re-education;Patient/family education;Manual techniques;Taping;Dry needling    PT Next Visit Plan review HEP; contnue trial of ionto; assess response to manual work and taping Rt posterior shoulder girdle to tirceps/posterior arm to elbow; progress with postural strengthening    PT Home Exercise Plan [email or web address]    Consulted and Agree with Plan of Care Patient             Patient will benefit from skilled therapeutic intervention in order to improve the following deficits and impairments:     Visit Diagnosis: Pain in right elbow  Chronic right shoulder pain  Other symptoms and signs involving the musculoskeletal system     Problem List Patient Active Problem List   Diagnosis Date Noted   Chronic elbow pain, right 10/12/2021   Chronic right shoulder pain 10/12/2021    Josef Tourigny 14/07/2021, PT, MPH  10/26/2021, 11:50 AM  [facility] [address] [place], [place], [place] Phone: [phone]   Fax:  [phone]  Name: Jennifer Novak MRN: Willa Frater Date of Birth: 28-Jan-1941

## 2021-10-31 ENCOUNTER — Ambulatory Visit (INDEPENDENT_AMBULATORY_CARE_PROVIDER_SITE_OTHER): Payer: Medicare Other | Admitting: Physical Therapy

## 2021-10-31 ENCOUNTER — Other Ambulatory Visit: Payer: Self-pay

## 2021-10-31 DIAGNOSIS — R29898 Other symptoms and signs involving the musculoskeletal system: Secondary | ICD-10-CM | POA: Diagnosis not present

## 2021-10-31 DIAGNOSIS — M25511 Pain in right shoulder: Secondary | ICD-10-CM | POA: Diagnosis not present

## 2021-10-31 DIAGNOSIS — M25521 Pain in right elbow: Secondary | ICD-10-CM

## 2021-10-31 DIAGNOSIS — G8929 Other chronic pain: Secondary | ICD-10-CM

## 2021-10-31 NOTE — Therapy (Signed)
[facility] [place] [address] [place] [address] [place], [place], [address] Phone: [phone]   Fax:  [phone]  Physical Therapy Treatment  Patient Details  Name: Jennifer Novak MRN: [id number] Date of Birth: [birth year] Referring Provider (PT): Dr Benjamin Stain   Encounter Date: 10/31/2021   PT End of Session - 10/31/21 0935     Visit Number 3    Number of Visits 12    Date for PT Re-Evaluation 12/05/21    Authorization - Visit Number 3    Progress Note Due on Visit 10    PT Start Time 0855    PT Stop Time 0933    PT Time Calculation (min) 38 min    Activity Tolerance Patient tolerated treatment well    Behavior During Therapy WFL for tasks assessed/performed             Past Medical History:  Diagnosis Date   GERD (gastroesophageal reflux disease)    Hypertension     Past Surgical History:  Procedure Laterality Date   ABDOMINAL HYSTERECTOMY     FOOT SURGERY Left    strightened two toes   KNEE ARTHROPLASTY Bilateral    ROTATOR CUFF REPAIR Right    SALIVARY STONE REMOVAL N/A 03/28/2018   Procedure: TRANSORAL SALIVARY STONE REMOVAL/SIALODUCOPLASTY;  Surgeon: Serena Colonel, MD;  Location: [facility] OR;  Service: ENT;  Laterality: N/A;    There were no vitals filed for this visit.   Subjective Assessment - 10/31/21 0857     Subjective Pt states her elbow is feeling better. She thinks the ionto is helping a lot    Patient Stated Goals get rid of the pain in the Rt arm to be able to put dishes in the cabinet    Currently in Pain? Yes    Pain Score 1     Pain Location Elbow    Pain Orientation Right    Pain Descriptors / Indicators Sore    Pain Type Chronic pain                               [facility] Adult PT Treatment/Exercise - 10/31/21 0001       Shoulder Exercises: Supine   Flexion AAROM;Both;10 reps   with cane     Shoulder Exercises: Seated   Other Seated Exercises scap squeeze 10 x 5 sec, posterior  shoulder rolls x 10    Other Seated Exercises pulley flexion x 2 minutes AAROM      Shoulder Exercises: Standing   Extension 20 reps    Theraband Level (Shoulder Extension) Level 1 (Yellow)    Row 20 reps    Theraband Level (Shoulder Row) Level 1 (Yellow)      Shoulder Exercises: Stretch   Other Shoulder Stretches stargazer stretch 2 x 30 sec      Iontophoresis   Type of Iontophoresis Dexamethasone    Location Rt olecranon/distal triceps    Dose 1 cc; 80 mAmp dose    Time 8 hours      Manual Therapy   Manual therapy comments performed in Lt sidelying with arm supported.    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    Time 6    Period Weeks    Status New    Target Date 12/05/21      PT LONG TERM GOAL #2   Title Decrease palpable tightness and pain in the Rt triceps/olecranon area allowing patient to use Rt UE for functional activities    Time 6    Period Weeks    Status New    Target Date 12/05/21      PT LONG TERM GOAL #3   Title Independent in HEP    Time 6    Period Weeks    Status New    Target Date 12/05/21      PT LONG TERM GOAL #4   Title Improve functional limitation score to 62    Time 6    Period Weeks    Status New    Target Date 12/05/21                   Plan - 10/31/21 0936     Clinical Impression Statement Pt continues with good response to ionto. She states her pain is "much less". Pt with good tolerance to addition of postural strength this visit    PT Next Visit Plan progress postural strength and ROM    PT Home Exercise Plan  [email or web address]    Consulted and Agree with Plan of Care Patient             Patient will benefit from skilled therapeutic intervention in order to improve the following deficits and impairments:     Visit Diagnosis: Pain in right elbow  Chronic right shoulder pain  Other symptoms and signs involving the musculoskeletal system     Problem List Patient Active Problem List   Diagnosis Date Noted   Chronic elbow pain, right 10/12/2021   Chronic right shoulder pain 10/12/2021    Benson Porcaro, PT 10/31/2021, 9:37 AM  [facility] [address] [place] [address] [place], [place], [address] Phone: [phone]   Fax:  [phone]  Name: Jennifer Novak MRN: [id number] Date of Birth: [birth year]

## 2021-11-01 ENCOUNTER — Ambulatory Visit (INDEPENDENT_AMBULATORY_CARE_PROVIDER_SITE_OTHER): Payer: Medicare Other | Admitting: Physical Therapy

## 2021-11-01 DIAGNOSIS — M25521 Pain in right elbow: Secondary | ICD-10-CM

## 2021-11-01 DIAGNOSIS — M25511 Pain in right shoulder: Secondary | ICD-10-CM | POA: Diagnosis not present

## 2021-11-01 DIAGNOSIS — R29898 Other symptoms and signs involving the musculoskeletal system: Secondary | ICD-10-CM | POA: Diagnosis not present

## 2021-11-01 DIAGNOSIS — G8929 Other chronic pain: Secondary | ICD-10-CM

## 2021-11-01 NOTE — Therapy (Signed)
[facility] [place] [address] [place] [address] [place], [place], [address] Phone: [phone]   Fax:  [phone]  Physical Therapy Treatment  Patient Details  Name: Jennifer Novak MRN: [id number] Date of Birth: Jun 15, 1941 Referring Provider (PT): Dr Benjamin Stain   Encounter Date: 11/01/2021   PT End of Session - 11/01/21 1619     Visit Number 4    Number of Visits 12    Date for PT Re-Evaluation 12/05/21    Authorization - Visit Number 4    Progress Note Due on Visit 10    PT Start Time 1620    PT Stop Time 1655    PT Time Calculation (min) 35 min    Activity Tolerance Patient tolerated treatment well    Behavior During Therapy WFL for tasks assessed/performed             Past Medical History:  Diagnosis Date   GERD (gastroesophageal reflux disease)    Hypertension     Past Surgical History:  Procedure Laterality Date   ABDOMINAL HYSTERECTOMY     FOOT SURGERY Left    strightened two toes   KNEE ARTHROPLASTY Bilateral    ROTATOR CUFF REPAIR Right    SALIVARY STONE REMOVAL N/A 03/28/2018   Procedure: TRANSORAL SALIVARY STONE REMOVAL/SIALODUCOPLASTY;  Surgeon: Serena Colonel, MD;  Location: [facility] OR;  Service: ENT;  Laterality: N/A;    There were no vitals filed for this visit.   Subjective Assessment - 11/01/21 1625     Subjective Pt reports she is wearing elbow brace with some relief.  She has adhesive left over from tape and ionto patches. Pain no longer travels up to shoulder.  She hasn't tried putting dishes away.    Patient Stated Goals get rid of the pain in the Rt arm to be able to put dishes in the cabinet    Currently in Pain? Yes    Pain Score 2     Pain Location Elbow    Pain Orientation Right    Pain Descriptors / Indicators Sore    Aggravating Factors  ?    Pain Relieving Factors voltaren, ionto patch                [facility] PT Assessment - 11/01/21 0001       Assessment   Medical Diagnosis Rt elow pain;  Rt shoulder pain    Referring Provider (PT) Dr Benjamin Stain    Onset Date/Surgical Date 02/20/21    Hand Dominance Right    Next MD Visit 11/09/21    Prior Therapy for shoulder and knees              [facility] Adult PT Treatment/Exercise - 11/01/21 0001       Exercises   Exercises Wrist      Shoulder Exercises: Seated   Row Strengthening;Both;12 reps   2 sets   Other Seated Exercises shoulder ext x 10 reps, bilat, 2 sets      Shoulder Exercises: ROM/Strengthening   UBE (Upper Arm Bike) L1: (seated) x 1.5 min each direction, then 30 sec forward.      Wrist Exercises   Wrist Flexion Strengthening;Right;10 reps    Bar Weights/Barbell (Wrist Flexion) 2 lbs    Wrist Extension Strengthening;Right;10 reps    Bar Weights/Barbell (Wrist Extension) 2 lbs    Other wrist exercises Rt forearm pronation/ supination with 2# x 20.  Wringing of yellow flex bar x 10   Other wrist exercises Rt wrist flex and  ext stretches x 20 sec each direction      Manual Therapy   Soft tissue mobilization STM working through the Rt triceps brachii, medial/lateral supracondylar ridge area, Rt wrist flexors and extensors.      Kinesiotix   Create Space I strip of reg Rock tape applied to Rt olecranon with 50% stretch to decompress tissue.                 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    Time 6    Period Weeks    Status New    Target Date 12/05/21      PT LONG TERM GOAL #2   Title Decrease palpable tightness and pain in the Rt triceps/olecranon area allowing patient to use Rt UE for functional activities    Time 6    Period Weeks    Status New    Target Date 12/05/21      PT LONG TERM GOAL #3   Title Independent in HEP    Time 6    Period Weeks    Status New    Target Date 12/05/21      PT LONG TERM GOAL #4   Title  Improve functional limitation score to 62    Time 6    Period Weeks    Status New    Target Date 12/05/21                   Plan - 11/01/21 1754     Clinical Impression Statement Pt presents with less Rt elbow pain today.  Trialed Rt shoulder, wrist, and forarm strengthening with good tolerance.  Progressing well towards LTGs.    PT Frequency 2x / week    PT Duration 6 weeks    PT Treatment/Interventions ADLs/Self Care Home Management;Cryotherapy;Electrical Stimulation;Iontophoresis 4mg /ml Dexamethasone;Moist Heat;Ultrasound;Therapeutic activities;Therapeutic exercise;Neuromuscular re-education;Patient/family education;Manual techniques;Taping;Dry needling    PT Next Visit Plan FOTO.  Functional strengthening for RUE   PT Home Exercise Plan [email or web address]    Consulted and Agree with Plan of Care Patient             Patient will benefit from skilled therapeutic intervention in order to improve the following deficits and impairments:  Decreased range of motion, Decreased activity tolerance, Pain, Impaired flexibility, Improper body mechanics, Decreased mobility, Decreased strength, Postural dysfunction  Visit Diagnosis: Pain in right elbow  Chronic right shoulder pain  Other symptoms and signs involving the musculoskeletal system     Problem List Patient Active Problem List   Diagnosis Date Noted   Chronic elbow pain, right 10/12/2021   Chronic right shoulder pain 10/12/2021   14/07/2021, PTA 11/01/21 5:56 PM  [facility] [place] [address] [place] [address] [place], [place], [place] Phone: [phone]   Fax:  [phone]  Name: Jennifer Novak MRN: Willa Frater Date of Birth: 10-Jul-1941

## 2021-11-06 ENCOUNTER — Other Ambulatory Visit: Payer: Self-pay

## 2021-11-06 ENCOUNTER — Ambulatory Visit: Payer: Medicare Other | Attending: Sports Medicine | Admitting: Physical Therapy

## 2021-11-06 DIAGNOSIS — M25511 Pain in right shoulder: Secondary | ICD-10-CM | POA: Diagnosis present

## 2021-11-06 DIAGNOSIS — G8929 Other chronic pain: Secondary | ICD-10-CM | POA: Insufficient documentation

## 2021-11-06 DIAGNOSIS — R29898 Other symptoms and signs involving the musculoskeletal system: Secondary | ICD-10-CM

## 2021-11-06 DIAGNOSIS — M25521 Pain in right elbow: Secondary | ICD-10-CM

## 2021-11-06 NOTE — Therapy (Signed)
[place], [place], [address] Phone: [phone]   Fax:  [phone]  Physical Therapy Treatment  Patient Details  Name: Jennifer Novak MRN: [id number] Date of Birth: [birth year] Referring Provider (PT): Dr Dianah Field   Encounter Date: 11/06/2021   PT End of Session - 11/06/21 1022     Visit Number 5    Number of Visits 12    Date for PT Re-Evaluation 12/05/21    Authorization - Visit Number 5    Progress Note Due on Visit 10    PT Start Time 1017    PT Stop Time 1058    PT Time Calculation (min) 41 min    Activity Tolerance Patient tolerated treatment well    Behavior During Therapy WFL for tasks assessed/performed             Past Medical History:  Diagnosis Date   GERD (gastroesophageal reflux disease)    Hypertension     Past Surgical History:  Procedure Laterality Date   ABDOMINAL HYSTERECTOMY     FOOT SURGERY Left    strightened two toes   KNEE ARTHROPLASTY Bilateral    ROTATOR CUFF REPAIR Right    SALIVARY STONE REMOVAL N/A 03/28/2018   Procedure: TRANSORAL SALIVARY STONE REMOVAL/SIALODUCOPLASTY;  Surgeon: Izora Gala, MD;  Location: [place];  Service: ENT;  Laterality: N/A;    There were no vitals filed for this visit.   Subjective Assessment - 11/06/21 1023     Subjective Pt reports she now only has "occasional" pain in her Rt elbow.  She was able to put 2 single dishes away overhead without any issues.  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 No/denies    Pain Score 0-No pain    Pain Orientation Right                [facility] PT Assessment - 11/06/21 0001       Assessment   Medical Diagnosis Rt elbow pain; Rt shoulder pain    Referring  Provider (PT) Dr Dianah Field    Onset Date/Surgical Date 02/20/21    Hand Dominance Right    Next MD Visit 11/09/21    Prior Therapy for shoulder and knees      Strength   Right Shoulder Flexion 4-/5   with pain   Right Shoulder ABduction 4+/5    Right Shoulder External Rotation 4/5              [facility] Adult PT Treatment/Exercise - 11/06/21 0001       Elbow Exercises   Elbow Flexion Right;10 reps   2 sets   Bar Weights/Barbell (Elbow Flexion) 2 lbs    Elbow Flexion Limitations unable to tolerate 3#, mild discomfort in olecranon at end range flexion    Elbow Extension Strengthening;Right;10 reps    Theraband Level (Elbow Extension) Level 1 (Yellow)      Shoulder Exercises: Seated   Row Strengthening;Both;12 reps   2 sets   Theraband Level (Shoulder Row) Level 2 (Red)    External Rotation Both;10 reps;Strengthening   2 sets   Theraband Level (Shoulder External Rotation) Level 1 (Yellow)  Shoulder Exercises: ROM/Strengthening   Nustep L5: arms/legs x 5 min for warm up.      Shoulder Exercises: Stretch   Corner Stretch 2 reps;20 seconds   limited tolerance.     Wrist Exercises   Wrist Flexion Strengthening;Right;20 reps    Bar Weights/Barbell (Wrist Flexion) 2 lbs    Wrist Extension Strengthening;Right;20 reps    Bar Weights/Barbell (Wrist Extension) 2 lbs    Other wrist exercises Rt forearm pronation/ supination with 2# x 20    Other wrist exercises Rt wrist flex and ext stretches x 20 sec each direction      Manual Therapy   Soft tissue mobilization STM working through the Rt triceps brachii, medial/lateral supracondylar ridge area, Rt wrist extensors.      Kinesiotix   Create Space I strip of reg Rock tape applied in x patternto Rt olecranon with 50% stretch to decompress tissue.                          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    Time 6    Period Weeks    Status Partially Met    Target Date 12/05/21      PT LONG TERM GOAL #2   Title Decrease palpable tightness and pain in the Rt triceps/olecranon area allowing patient to use Rt UE for functional activities    Time 6    Period Weeks    Status Partially Met    Target Date 12/05/21      PT LONG TERM GOAL #3   Title Independent in HEP    Time 6    Period Weeks    Status On-going    Target Date 12/05/21      PT LONG TERM GOAL #4   Title Improve functional limitation score to 62    Time 6    Period Weeks    Status On-going    Target Date 12/05/21                   Plan - 11/06/21 1105     Clinical Impression Statement Pt has no pain at rest, but some discomfort with resisted end range flexion.  Continues to get relief with ktape application.  RUE strength improving gradually. Pt reporting more functional use of RUE, but still limited with overhead tasks with increased weight.Pt has partially met her goals.  Pt is pleased with level of function. She voiced interest in d/c after next visit.    PT Frequency 2x / week    PT Duration 6 weeks    PT Treatment/Interventions ADLs/Self Care Home Management;Cryotherapy;Electrical Stimulation;Iontophoresis 50m/ml Dexamethasone;Moist Heat;Ultrasound;Therapeutic activities;Therapeutic exercise;Neuromuscular re-education;Patient/family education;Manual techniques;Taping;Dry needling    PT Next Visit Plan progress postural and UE strength and ROM.  assess goals;FOTO;  MD note.    PT Home Exercise Plan [email or web address]    Consulted and Agree with Plan of Care Patient             Patient will benefit from skilled therapeutic intervention in order to improve the following deficits and impairments:  Decreased range of motion, Decreased activity tolerance, Pain, Impaired flexibility, Improper body mechanics, Decreased mobility, Decreased strength, Postural  dysfunction  Visit Diagnosis: Pain in right elbow  Chronic  right shoulder pain  Other symptoms and signs involving the musculoskeletal system     Problem List Patient Active Problem List   Diagnosis Date Noted   Chronic elbow pain, right 10/12/2021   Chronic right shoulder pain 10/12/2021   Kerin Perna, PTA 11/06/21 1:03 PM  [place], [place], [address] Phone: [phone]   Fax:  [phone]  Name: Jennifer Novak MRN: [id number] Date of Birth: [birth year]

## 2021-11-08 ENCOUNTER — Ambulatory Visit: Payer: Medicare Other | Admitting: Physical Therapy

## 2021-11-08 ENCOUNTER — Other Ambulatory Visit: Payer: Self-pay

## 2021-11-08 ENCOUNTER — Encounter: Payer: Self-pay | Admitting: Physical Therapy

## 2021-11-08 DIAGNOSIS — R29898 Other symptoms and signs involving the musculoskeletal system: Secondary | ICD-10-CM

## 2021-11-08 DIAGNOSIS — M25521 Pain in right elbow: Secondary | ICD-10-CM

## 2021-11-08 DIAGNOSIS — M25511 Pain in right shoulder: Secondary | ICD-10-CM

## 2021-11-08 DIAGNOSIS — G8929 Other chronic pain: Secondary | ICD-10-CM

## 2021-11-08 NOTE — Patient Instructions (Signed)
Access Code: [email or web address] URL: [email or web address] Date: 11/08/2021 Prepared by: [facility] [place]  Exercises Seated Scapular Retraction - 2 x daily - 7 x weekly - 1-2 sets - 10 reps - 10 sec hold Standing Backward Shoulder Rolls - 2 x daily - 7 x weekly - 1 sets - 10 reps - 1-2 sec hold Seated Elbow Extension with Resistance - 1 x daily - 3 x weekly - 2 sets - 10 reps Seated Elbow Flexion with Resistance - 1 x daily - 3 x weekly - 2 sets - 10 reps Seated Shoulder Row with Resistance Anchored at Feet - 1 x daily - 3 x weekly - 2 sets - 10 reps Seated Single Arm Shoulder Flexion with Dumbbells - 1 x daily - 3 x weekly - 2 sets - 5-10 reps Seated Single Arm Shoulder Flexion with Dumbbell - 1 x daily - 3 x weekly - 1 sets - 5-10 reps

## 2021-11-08 NOTE — Therapy (Addendum)
[place], [place], [address] Phone: [phone]   Fax:  [phone]  Physical Therapy Treatment and Discharge Summary  PHYSICAL THERAPY DISCHARGE SUMMARY  Visits from Start of Care: 6  Current functional level related to goals / functional outcomes: See progress note for discharge status   Remaining deficits: Doing well as last visit    Education / Equipment: HEP    Patient agrees to discharge. Patient goals were met. Patient is being discharged due to being pleased with the current functional level.  Celyn P. Helene Kelp PT, MPH 12/07/21 2:37 PM    Patient Details  Name: Jennifer Novak MRN: [id number] Date of Birth: 04/10/1941 Referring Provider (PT): Dr Dianah Field   Encounter Date: 11/08/2021   PT End of Session - 11/08/21 1109     Visit Number 6    Number of Visits 12    Date for PT Re-Evaluation 12/05/21    Authorization - Visit Number 6    Progress Note Due on Visit 10    PT Start Time 1107    PT Stop Time 1144    PT Time Calculation (min) 37 min    Activity Tolerance Patient tolerated treatment well    Behavior During Therapy WFL for tasks assessed/performed             Past Medical History:  Diagnosis Date   GERD (gastroesophageal reflux disease)    Hypertension     Past Surgical History:  Procedure Laterality Date   ABDOMINAL HYSTERECTOMY     FOOT SURGERY Left    strightened two toes   KNEE ARTHROPLASTY Bilateral    ROTATOR CUFF REPAIR Right    SALIVARY STONE REMOVAL N/A 03/28/2018   Procedure: TRANSORAL SALIVARY STONE REMOVAL/SIALODUCOPLASTY;  Surgeon: Izora Gala, MD;  Location: [facility];  Service: ENT;  Laterality: N/A;    There were no vitals filed for this visit.   Subjective Assessment - 11/08/21 1110     Subjective Pt reports her Rt elbow has felt irritated since doing the exercises.  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    Patient Stated Goals get rid of the pain in the Rt arm to be able to put dishes in the cabinet    Currently in Pain? Yes    Pain Score 2     Pain Location Elbow    Pain Orientation Right    Pain Descriptors / Indicators Aching    Aggravating Factors  ?    Pain Relieving Factors voltaren, tape.                [facility] PT Assessment - 11/08/21 0001       Assessment   Medical Diagnosis Rt elbow pain; Rt shoulder pain    Referring Provider (PT) Dr Dianah Field    Onset Date/Surgical Date 02/20/21    Hand Dominance Right    Next MD Visit 11/09/21    Prior Therapy for shoulder and knees      Observation/Other Assessments   Focus on Therapeutic Outcomes (FOTO)  57 functional score:  goal of 62.      Strength   Right Shoulder Flexion 4-/5   with pain   Right Shoulder ABduction 4+/5    Right Shoulder External Rotation 4/5               [facility] Adult PT Treatment/Exercise - 11/08/21 0001  Elbow Exercises   Elbow Flexion Strengthening;Right;15 reps    Theraband Level (Elbow Flexion) Level 1 (Yellow)    Elbow Extension Strengthening;Right;10 reps    Theraband Level (Elbow Extension) Level 1 (Yellow)      Shoulder Exercises: Seated   Retraction Both;5 reps   5 sec hold   Row Strengthening;Both;20 reps;Theraband    Theraband Level (Shoulder Row) Level 1 (Yellow)   hooked to foot on ground   Flexion Right;Strengthening;5 reps   3 sets;  1 with yellow band to shoulder height, 1 with 1# to shoulder height, 1 as overhead press with 1#.   Flexion Limitations 1 set of 5 reps with yellow band to 90 with LUE    Other Seated Exercises shoulder rolls x 10 CW      Shoulder Exercises: ROM/Strengthening   UBE (Upper Arm Bike) L1: (seated) x 2 min backward, 1 min forward      Shoulder Exercises: Stretch   Table Stretch - Flexion 5 reps;10 seconds      Manual Therapy   Soft tissue mobilization STM working through the Rt triceps brachii, medial/lateral  supracondylar ridge area, Rt wrist extensors.      Kinesiotix   Create Space I strip of reg Rock tape applied to Rt olecranon with 50% stretch to decompress tissue.             Education: Handout provided with updated exercises for UE. Pt verbalized understanding.       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    Time 6    Period Weeks    Status Achieved    Target Date 12/05/21      PT LONG TERM GOAL #2   Title Decrease palpable tightness and pain in the Rt triceps/olecranon area allowing patient to use Rt UE for functional activities    Time 6    Period Weeks    Status Achieved    Target Date 12/05/21      PT LONG TERM GOAL #3   Title Independent in HEP    Time 6    Period Weeks    Status Achieved    Target Date 12/05/21      PT LONG TERM GOAL #4   Title Improve functional limitation score to 62    Baseline 57: 11/08/21    Time 6    Period Weeks    Status On-going    Target Date 12/05/21                   Plan - 11/08/21 1158     Clinical Impression Statement Pt reporting muscle soreness with addition of UE strengthening exercises.  No additional pain reported at elbow today.  Pt tolerated UE exercises with light resistance.  Pt has met majority of goals and is pleased with level of function.  Will hold therapy until upcoming MD visit.    PT Frequency 2x / week    PT Duration 6 weeks    PT Treatment/Interventions ADLs/Self Care Home Management;Cryotherapy;Electrical Stimulation;Iontophoresis 47m/ml Dexamethasone;Moist Heat;Ultrasound;Therapeutic activities;Therapeutic exercise;Neuromuscular re-education;Patient/family education;Manual techniques;Taping;Dry needling    PT Next Visit Plan will hold until 12/05/21; if pt doesn't return prior to this date, will d/c.    PT Home Exercise Plan  [email or web address]    Consulted and Agree with  Plan of Care Patient             Patient will benefit from skilled therapeutic intervention in order to improve the following deficits and impairments:  Decreased range of motion, Decreased activity tolerance, Pain, Impaired flexibility, Improper body mechanics, Decreased mobility, Decreased strength, Postural dysfunction  Visit Diagnosis: Pain in right elbow  Chronic right shoulder pain  Other symptoms and signs involving the musculoskeletal system     Problem List Patient Active Problem List   Diagnosis Date Noted   Chronic elbow pain, right 10/12/2021   Chronic right shoulder pain 10/12/2021   Kerin Perna, PTA 11/08/21 12:02 PM  [place], [place], [address] Phone: [phone]   Fax:  [phone]  Name: Jennifer Novak MRN: [id number] Date of Birth: [birth year]

## 2021-11-09 ENCOUNTER — Ambulatory Visit (INDEPENDENT_AMBULATORY_CARE_PROVIDER_SITE_OTHER): Payer: Medicare Other | Admitting: Sports Medicine

## 2021-11-09 DIAGNOSIS — M25521 Pain in right elbow: Secondary | ICD-10-CM

## 2021-11-09 DIAGNOSIS — M25511 Pain in right shoulder: Secondary | ICD-10-CM | POA: Diagnosis not present

## 2021-11-09 DIAGNOSIS — G8929 Other chronic pain: Secondary | ICD-10-CM | POA: Diagnosis not present

## 2021-11-09 NOTE — Assessment & Plan Note (Signed)
Long history of shoulder pain as well, worse with abduction with positive impingement signs, x-rays unrevealing, she has done a lot better with formal physical therapy and Celebrex, she will likely go up to Celebrex twice daily and return to see me in 6 weeks for injection if not sufficiently better.

## 2021-11-09 NOTE — Assessment & Plan Note (Signed)
Mild elbow osteoarthritis, partially improved, he is only using Celebrex once a day, she will go up to twice daily. Return to see me in 6+ weeks, we can do an injection to the elbow joint if not sufficiently better.

## 2021-11-09 NOTE — Progress Notes (Signed)
° ° °  Procedures performed today:    None.  Independent interpretation of notes and tests performed by another provider:   None.  Brief History, Exam, Impression, and Recommendations:    Chronic elbow pain, right Mild elbow osteoarthritis, partially improved, he is only using Celebrex once a day, she will go up to twice daily. Return to see me in 6+ weeks, we can do an injection to the elbow joint if not sufficiently better.  Chronic right shoulder pain Long history of shoulder pain as well, worse with abduction with positive impingement signs, x-rays unrevealing, she has done a lot better with formal physical therapy and Celebrex, she will likely go up to Celebrex twice daily and return to see me in 6 weeks for injection if not sufficiently better.    ___________________________________________ Ihor Austin. Benjamin Stain, M.D., ABFM., CAQSM. Primary Care and Sports Medicine [facility]  Adjunct Instructor of Family Medicine  [employer]

## 2021-12-31 ENCOUNTER — Other Ambulatory Visit: Payer: Self-pay | Admitting: Sports Medicine

## 2021-12-31 DIAGNOSIS — G8929 Other chronic pain: Secondary | ICD-10-CM

## 2021-12-31 DIAGNOSIS — M25511 Pain in right shoulder: Secondary | ICD-10-CM

## 2022-01-22 ENCOUNTER — Emergency Department (INDEPENDENT_AMBULATORY_CARE_PROVIDER_SITE_OTHER)
Admission: EM | Admit: 2022-01-22 | Discharge: 2022-01-22 | Disposition: A | Discharge status: Home / Self Care | Payer: Medicare Other | Source: Home / Self Care

## 2022-01-22 ENCOUNTER — Other Ambulatory Visit: Payer: Self-pay

## 2022-01-22 DIAGNOSIS — N3001 Acute cystitis with hematuria: Secondary | ICD-10-CM | POA: Diagnosis not present

## 2022-01-22 DIAGNOSIS — R300 Dysuria: Secondary | ICD-10-CM

## 2022-01-22 LAB — POCT URINALYSIS DIP (MANUAL ENTRY)
Bilirubin, UA: NEGATIVE
Glucose, UA: NEGATIVE mg/dL
Nitrite, UA: POSITIVE — AB
Spec Grav, UA: >=1.03 — AB (ref 1.010–1.025)
Urobilinogen, UA: 1 E.U./dL
pH, UA: 5.5 (ref 5.0–8.0)

## 2022-01-22 MED ORDER — NITROFURANTOIN MONOHYD MACRO 100 MG PO CAPS: 100.0000 mg | ORAL_CAPSULE | Freq: Two times a day (BID) | ORAL | 0 refills | Status: AC

## 2022-01-22 NOTE — ED Provider Notes (Signed)
?[place] ? ? ? ?CSN: [id number] ?Arrival date & time: 01/22/22  1622 ? ? ?  ? ?History   ?Chief Complaint ?Chief Complaint  ?Patient presents with  ? Dysuria  ? Urinary Frequency  ?  And urgency  ? ? ?HPI ?Jennifer Novak is a 81 y.o. female.  ? ?HPI Pleasant 81 year old female presents with dysuria increased urine frequency for 1 week.  Reports drinking cranberry juice as needed.  PMH significant for HTN and GERD. ? ?Past Medical History:  ?Diagnosis Date  ? GERD (gastroesophageal reflux disease)   ? Hypertension   ? ? ?Patient Active Problem List  ? Diagnosis Date Noted  ? Chronic elbow pain, right 10/12/2021  ? Chronic right shoulder pain 10/12/2021  ? ? ?Past Surgical History:  ?Procedure Laterality Date  ? ABDOMINAL HYSTERECTOMY    ? FOOT SURGERY Left   ? strightened two toes  ? KNEE ARTHROPLASTY Bilateral   ? ROTATOR CUFF REPAIR Right   ? SALIVARY STONE REMOVAL N/A 03/28/2018  ? Procedure: TRANSORAL SALIVARY STONE REMOVAL/SIALODUCOPLASTY;  Surgeon: Izora Gala, MD;  Location: [place] [facility];  Service: ENT;  Laterality: N/A;  ? ? ?OB History   ?No obstetric history on file. ?  ? ? ? ?Home Medications   ? ?Prior to Admission medications   ?Medication Sig Start Date End Date Taking? Authorizing Provider  ?nitrofurantoin, macrocrystal-monohydrate, (MACROBID) 100 MG capsule Take 1 capsule (100 mg total) by mouth 2 (two) times daily for 7 days. 01/22/22 01/29/22 Yes Eliezer Lofts, FNP  ?celecoxib (CELEBREX) 200 MG capsule TAKE 1-2 CAPSULES BY MOUTH DAILY AS NEEDED FOR PAIN 12/31/21   Silverio Decamp, MD  ?chlorhexidine gluconate, MEDLINE KIT, (PERIDEX) 0.12 % solution Swish 10-13m liquid in mouth for 30 seconds and spit, twice daily after tooth brushing for 1 week 05/08/18   PNoe Gens PA-C  ?cholecalciferol (VITAMIN D) 1000 units tablet Take 1,000 Units by mouth daily.    [provider]  ?Cyanocobalamin (VITAMIN B-12) 1000 MCG SUBL Place 1,000 mcg under the tongue daily.    [provider]  ?diclofenac sodium (VOLTAREN) 1 % GEL Apply 1 application topically as needed for pain. 09/13/16   [provider]  ?estradiol (CLIMARA - DOSED IN MG/24 HR) 0.1 mg/24hr patch Place 1 patch onto the skin once a week.     [provider]  ?hydrochlorothiazide (HYDRODIURIL) 25 MG tablet Take 25 mg by mouth daily. 03/11/18   [provider]  ?losartan (COZAAR) 50 MG tablet TAKE 1 TABLET(50 MG) BY MOUTH DAILY FOR HIGH BLOOD PRESSURE 10/05/18   [provider]  ?pantoprazole (PROTONIX) 20 MG tablet Take 20 mg by mouth daily.    [provider]  ?promethazine (PHENERGAN) 25 MG suppository Place 1 suppository (25 mg total) rectally every 6 (six) hours as needed for nausea or vomiting. 03/28/18   RIzora Gala MD  ?terbinafine (LAMISIL) 250 MG tablet Take 250 mg by mouth daily. 10/30/19   [provider]  ? ? ?Family History ?History reviewed. No pertinent family history. ? ?Social History ?Social History  ? ?Tobacco Use  ? Smoking status: Passive Smoke Exposure - Never Smoker  ? Smokeless tobacco: Never  ?Vaping Use  ? Vaping Use: Never used  ?Substance Use Topics  ? Alcohol use: Yes  ?  Comment: Wine once in a while  ? Drug use: Never  ? ? ? ?Allergies   ?Penicillins, Clindamycin/lincomycin, Codeine, Eggs or egg-derived products, Hydrocodone, and Sulfa antibiotics ? ? ?  Review of Systems ?Review of Systems  ?Genitourinary:  Positive for dysuria and frequency.  ?All other systems reviewed and are negative. ? ? ?Physical Exam ?Triage Vital Signs ?ED Triage Vitals  ?Enc Vitals Group  ?   BP 01/22/22 1716 (!) 158/91  ?   Pulse Rate 01/22/22 1716 81  ?   Resp 01/22/22 1716 16  ?   Temp 01/22/22 1716 98.1 ?F (36.7 ?C)  ?   Temp Source 01/22/22 1716 Oral  ?   SpO2 01/22/22 1716 100 %  ?   Weight --   ?   Height --   ?   Head Circumference --   ?   Peak Flow --   ?   Pain Score 01/22/22 1714 0  ?   Pain Loc --   ?   Pain Edu? --   ?   Excl. in [place]? --   ? ?No data  found. ? ?Updated Vital Signs ?BP (!) 158/91 (BP Location: Right Arm)   Pulse 81   Temp 98.1 ?F (36.7 ?C) (Oral)   Resp 16   SpO2 100%  ? ?Physical Exam ?Vitals and nursing note reviewed.  ?Constitutional:   ?   General: She is not in acute distress. ?   Appearance: Normal appearance. She is obese. She is not ill-appearing.  ?HENT:  ?   Head: Normocephalic and atraumatic.  ?   Mouth/Throat:  ?   Mouth: Mucous membranes are moist.  ?   Pharynx: Oropharynx is clear.  ?Eyes:  ?   Extraocular Movements: Extraocular movements intact.  ?   Conjunctiva/sclera: Conjunctivae normal.  ?   Pupils: Pupils are equal, round, and reactive to light.  ?Cardiovascular:  ?   Rate and Rhythm: Normal rate and regular rhythm.  ?   Pulses: Normal pulses.  ?   Heart sounds: Normal heart sounds.  ?Pulmonary:  ?   Effort: Pulmonary effort is normal.  ?   Breath sounds: Normal breath sounds. No wheezing, rhonchi or rales.  ?Abdominal:  ?   Tenderness: There is no right CVA tenderness or left CVA tenderness.  ?Musculoskeletal:  ?   Cervical back: Normal range of motion and neck supple.  ?Skin: ?   General: Skin is warm and dry.  ?Neurological:  ?   General: No focal deficit present.  ?   Mental Status: She is alert and oriented to person, place, and time. Mental status is at baseline.  ? ? ? ?UC Treatments / Results  ?Labs ?(all labs ordered are listed, but only abnormal results are displayed) ?Labs Reviewed  ?POCT URINALYSIS DIP (MANUAL ENTRY) - Abnormal; Notable for the following components:  ?    Result Value  ? Clarity, UA cloudy (*)   ? Ketones, POC UA trace (5) (*)   ? Spec Grav, UA >=1.030 (*)   ? Blood, UA trace-intact (*)   ? Protein Ur, POC trace (*)   ? Nitrite, UA Positive (*)   ? Leukocytes, UA Moderate (2+) (*)   ? All other components within normal limits  ?URINE CULTURE  ? ? ?EKG ? ? ?Radiology ?No results found. ? ?Procedures ?Procedures (including critical care time) ? ?Medications Ordered in UC ?Medications - No data to  display ? ?Initial Impression / Assessment and Plan / UC Course  ?I have reviewed the triage vital signs and the nursing notes. ? ?Pertinent labs & imaging results that were available during my care of the patient were reviewed by me and  considered in my medical decision making (see chart for details). ? ?  ? ?MDM: 1.  Acute cystitis with hematuria-Rx'd Macrobid. Advised patient to take medication as directed with food to completion.  Encourage patient increase daily water intake while taking this medication.  Advised patient we will follow-up with urine culture results once received.  Advised patient if symptoms worsen and/or unresolved please follow-up with PCP or here for further evaluation.  Patient discharged home, hemodynamically stable. ?Final Clinical Impressions(s) / UC Diagnoses  ? ?Final diagnoses:  ?Dysuria  ?Acute cystitis with hematuria  ? ? ? ?Discharge Instructions   ? ?  ?Advised patient to take medication as directed with food to completion.  Encourage patient increase daily water intake while taking this medication.  Advised patient we will follow-up with urine culture results once received.  Advised patient if symptoms worsen and/or unresolved please follow-up with PCP or here for further evaluation. ? ? ? ? ?ED Prescriptions   ? ? Medication Sig Dispense Auth. Provider  ? nitrofurantoin, macrocrystal-monohydrate, (MACROBID) 100 MG capsule Take 1 capsule (100 mg total) by mouth 2 (two) times daily for 7 days. 14 capsule Eliezer Lofts, FNP  ? ?  ? ?PDMP not reviewed this encounter. ?  ?Eliezer Lofts, [place] ?01/22/22 1822 ? ?

## 2022-01-22 NOTE — ED Triage Notes (Signed)
Pt here today c/o dysuria and increased frequency x 1 week. Cranberry juice prn.  ?

## 2022-01-22 NOTE — Discharge Instructions (Addendum)
Advised patient to take medication as directed with food to completion.  Encourage patient increase daily water intake while taking this medication.  Advised patient we will follow-up with urine culture results once received.  Advised patient if symptoms worsen and/or unresolved please follow-up with PCP or here for further evaluation. 

## 2022-01-24 LAB — URINE CULTURE
MICRO NUMBER:: 13160543
SPECIMEN QUALITY:: ADEQUATE

## 2023-04-25 IMAGING — DX DG SHOULDER 2+V*R*
3 series · 3 of 3 positions shown · non-contrast
Comparison: None.

CLINICAL DATA: Pain for few months. History of surgery. Fall 1 year
ago. No recent injury.

EXAM:
RIGHT SHOULDER - 2+ VIEW

[shoulder grashey]
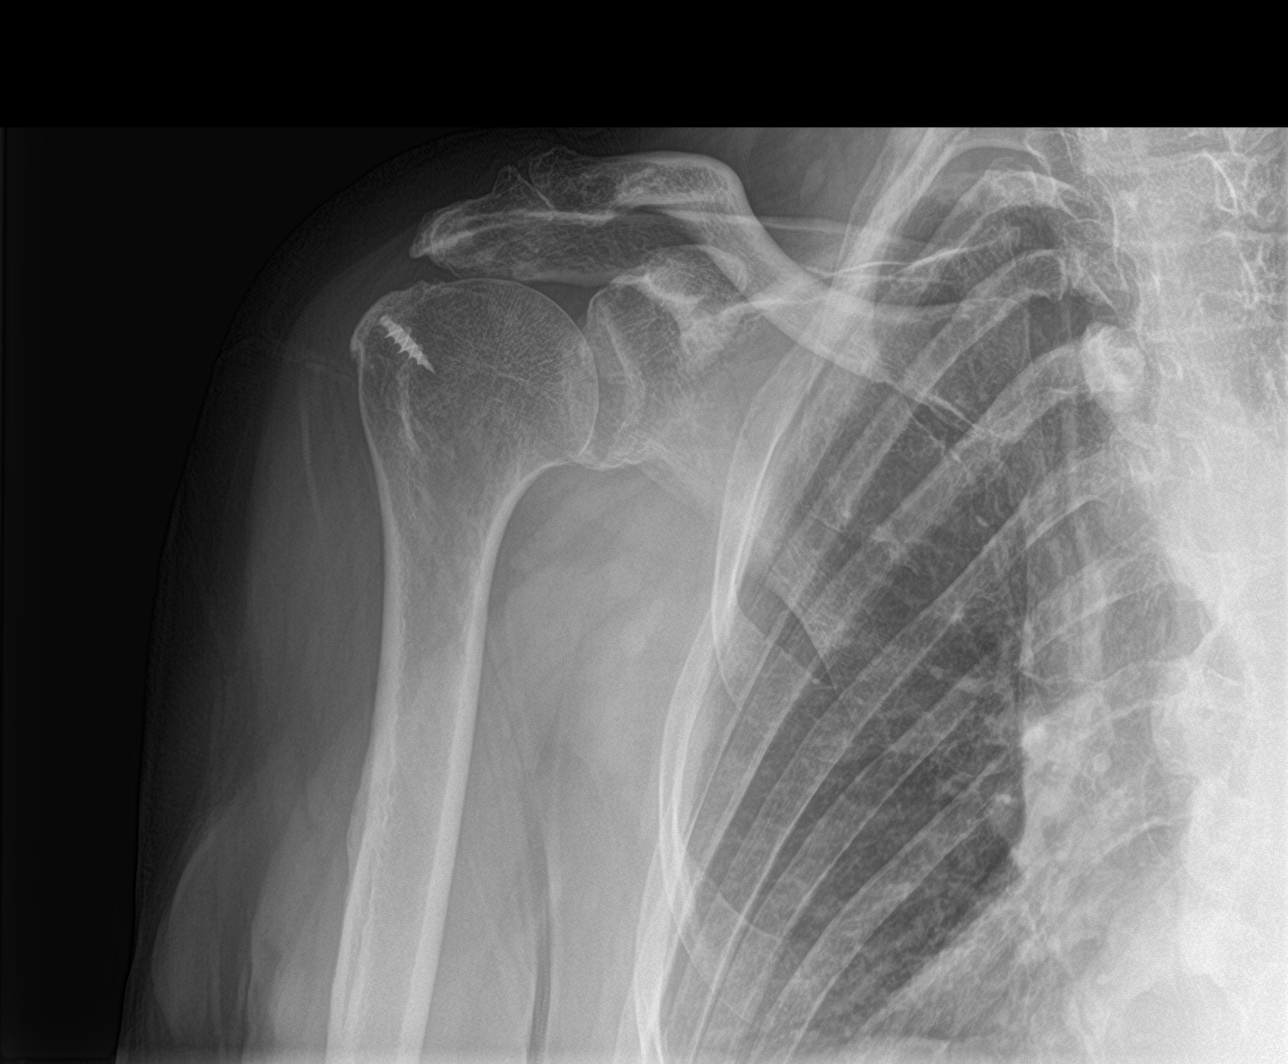

[shoulder y view]
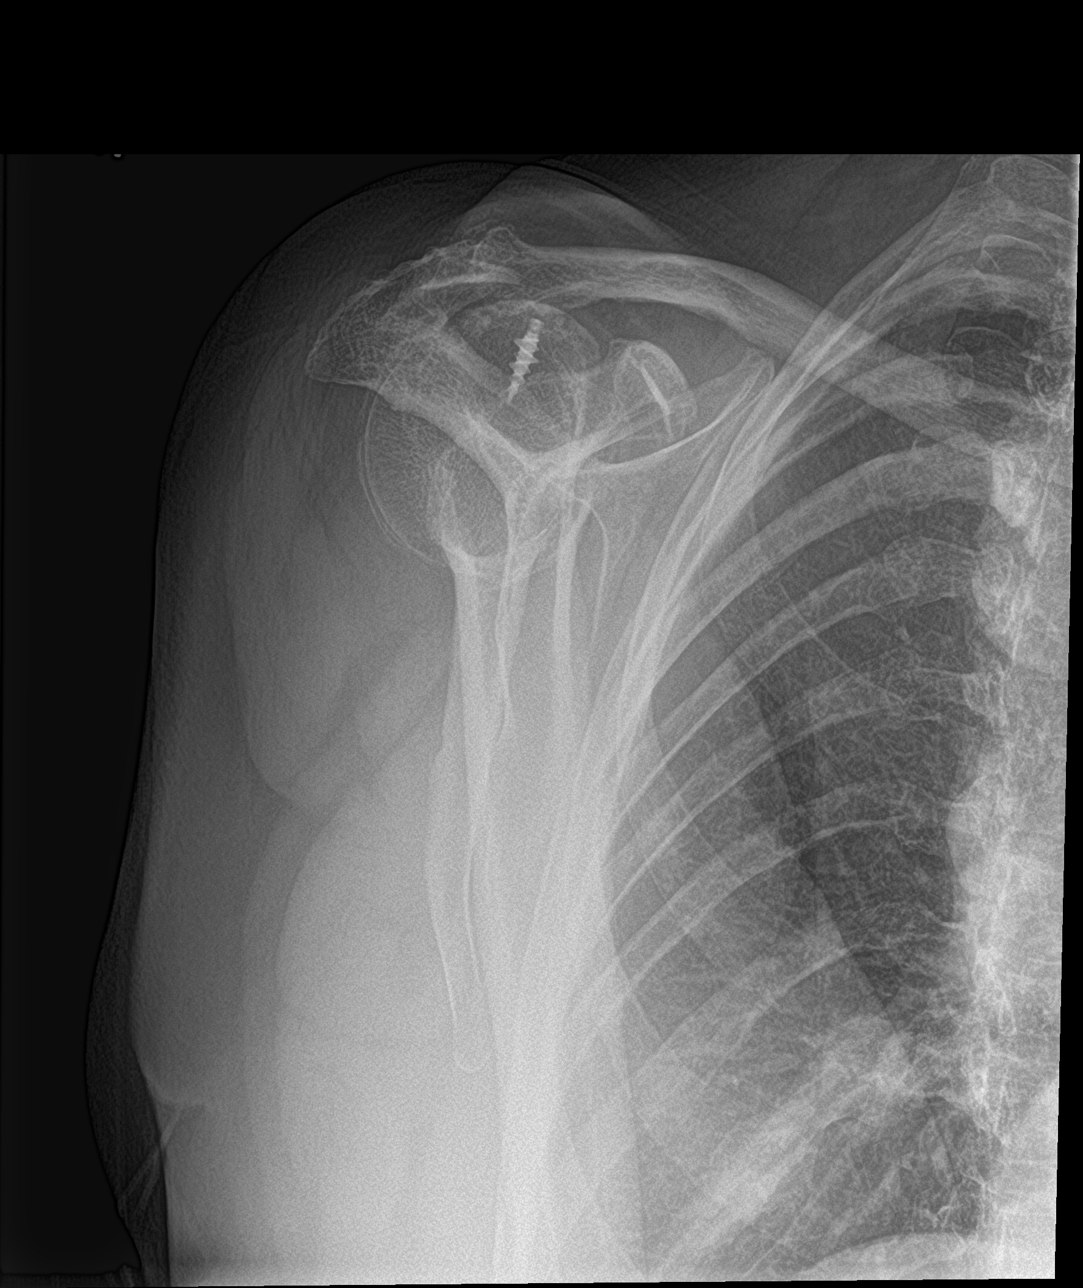

[shoulder axillary]
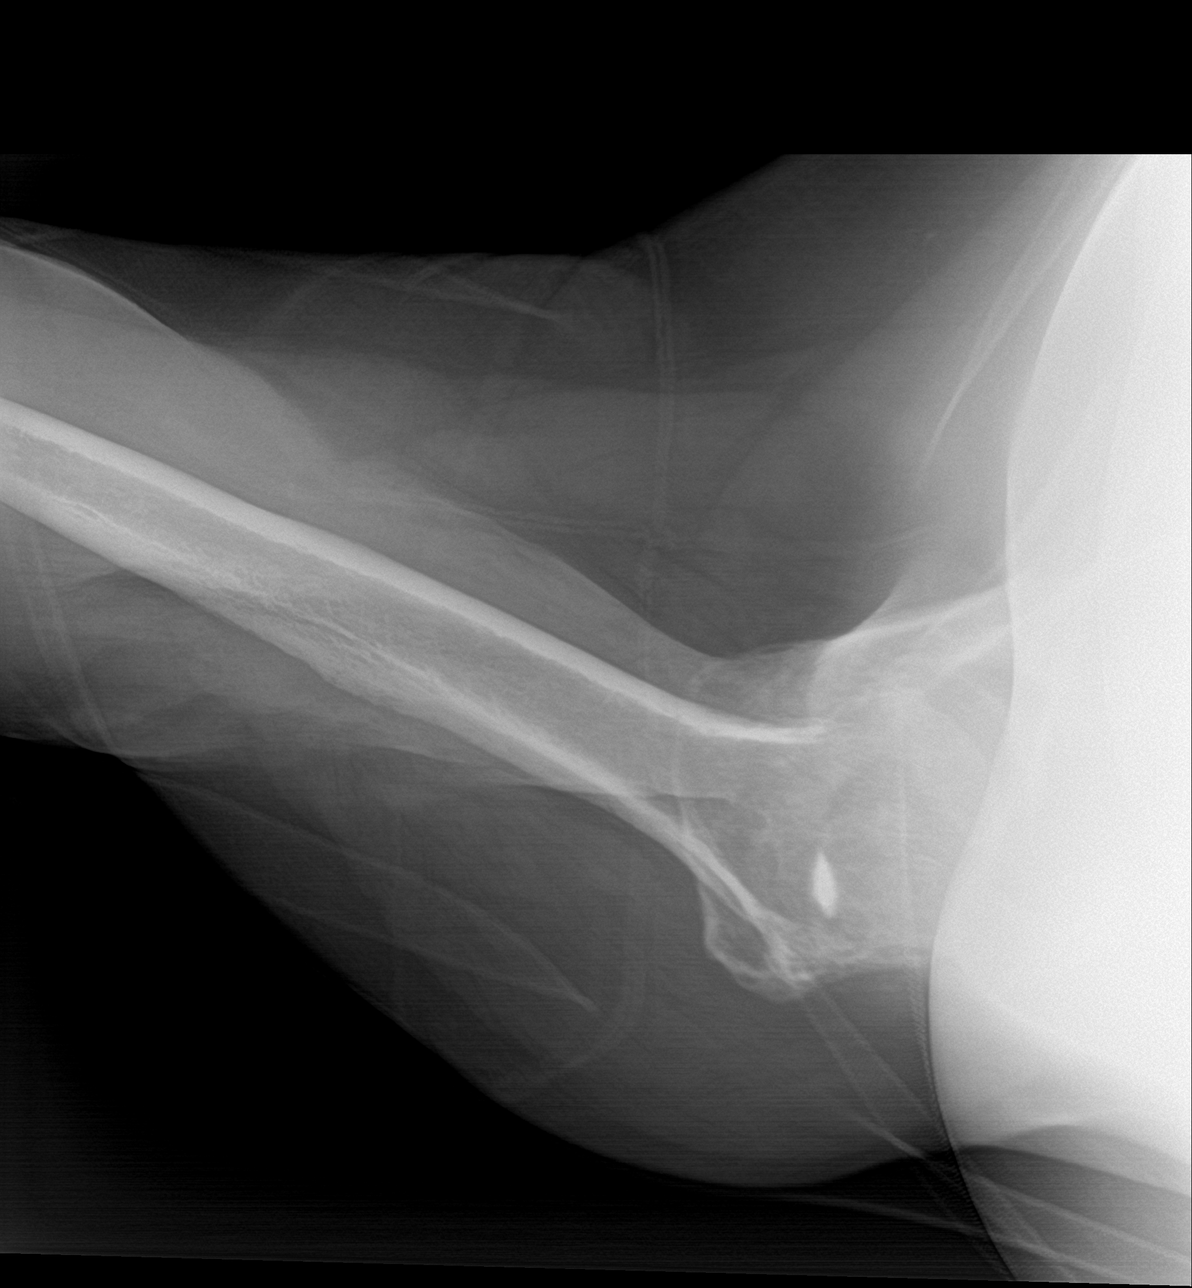

[3 of 3 positions shown; findings below may reference images not displayed]

FINDINGS: The patient is status post rotator cuff repair. No fracture or
dislocation. No other significant abnormalities.
IMPRESSION: Evidence of previous rotator cuff repair. No abnormalities to
explain the patient's symptoms.

## 2023-11-07 ENCOUNTER — Ambulatory Visit: Payer: Medicare Other | Admitting: Physician Assistant

## 2023-12-05 ENCOUNTER — Emergency Department (HOSPITAL_COMMUNITY): Payer: Medicare Other

## 2023-12-05 ENCOUNTER — Inpatient Hospital Stay (HOSPITAL_COMMUNITY)
Admission: EM | Admit: 2023-12-05 | Discharge: 2023-12-07 | DRG: 391 | Disposition: A | Discharge status: Home / Self Care | Payer: Medicare Other | Attending: Family Medicine | Admitting: Family Medicine

## 2023-12-05 ENCOUNTER — Encounter (HOSPITAL_COMMUNITY): Payer: Self-pay

## 2023-12-05 ENCOUNTER — Other Ambulatory Visit: Payer: Self-pay

## 2023-12-05 DIAGNOSIS — E876 Hypokalemia: Secondary | ICD-10-CM | POA: Diagnosis present

## 2023-12-05 DIAGNOSIS — I10 Essential (primary) hypertension: Secondary | ICD-10-CM | POA: Diagnosis present

## 2023-12-05 DIAGNOSIS — I700 Atherosclerosis of aorta: Secondary | ICD-10-CM | POA: Diagnosis present

## 2023-12-05 DIAGNOSIS — A419 Sepsis, unspecified organism: Secondary | ICD-10-CM | POA: Diagnosis not present

## 2023-12-05 DIAGNOSIS — M1990 Unspecified osteoarthritis, unspecified site: Secondary | ICD-10-CM | POA: Diagnosis present

## 2023-12-05 DIAGNOSIS — Z974 Presence of external hearing-aid: Secondary | ICD-10-CM | POA: Diagnosis not present

## 2023-12-05 DIAGNOSIS — Z881 Allergy status to other antibiotic agents status: Secondary | ICD-10-CM

## 2023-12-05 DIAGNOSIS — G9341 Metabolic encephalopathy: Secondary | ICD-10-CM | POA: Diagnosis present

## 2023-12-05 DIAGNOSIS — Z885 Allergy status to narcotic agent status: Secondary | ICD-10-CM

## 2023-12-05 DIAGNOSIS — Z880 Allergy status to penicillin: Secondary | ICD-10-CM

## 2023-12-05 DIAGNOSIS — Z96653 Presence of artificial knee joint, bilateral: Secondary | ICD-10-CM | POA: Diagnosis present

## 2023-12-05 DIAGNOSIS — H9190 Unspecified hearing loss, unspecified ear: Secondary | ICD-10-CM | POA: Diagnosis present

## 2023-12-05 DIAGNOSIS — Z9621 Cochlear implant status: Secondary | ICD-10-CM | POA: Diagnosis present

## 2023-12-05 DIAGNOSIS — Z90710 Acquired absence of both cervix and uterus: Secondary | ICD-10-CM | POA: Diagnosis not present

## 2023-12-05 DIAGNOSIS — Z1152 Encounter for screening for COVID-19: Secondary | ICD-10-CM

## 2023-12-05 DIAGNOSIS — K5792 Diverticulitis of intestine, part unspecified, without perforation or abscess without bleeding: Secondary | ICD-10-CM | POA: Diagnosis not present

## 2023-12-05 DIAGNOSIS — R6520 Severe sepsis without septic shock: Secondary | ICD-10-CM

## 2023-12-05 DIAGNOSIS — R1310 Dysphagia, unspecified: Secondary | ICD-10-CM | POA: Diagnosis present

## 2023-12-05 DIAGNOSIS — Z6830 Body mass index (BMI) 30.0-30.9, adult: Secondary | ICD-10-CM

## 2023-12-05 DIAGNOSIS — K219 Gastro-esophageal reflux disease without esophagitis: Secondary | ICD-10-CM | POA: Diagnosis present

## 2023-12-05 DIAGNOSIS — E872 Acidosis, unspecified: Secondary | ICD-10-CM | POA: Diagnosis present

## 2023-12-05 DIAGNOSIS — Z91012 Allergy to eggs: Secondary | ICD-10-CM | POA: Diagnosis not present

## 2023-12-05 DIAGNOSIS — Z79899 Other long term (current) drug therapy: Secondary | ICD-10-CM

## 2023-12-05 DIAGNOSIS — R680 Hypothermia, not associated with low environmental temperature: Secondary | ICD-10-CM | POA: Diagnosis present

## 2023-12-05 DIAGNOSIS — E669 Obesity, unspecified: Secondary | ICD-10-CM | POA: Diagnosis present

## 2023-12-05 DIAGNOSIS — K5732 Diverticulitis of large intestine without perforation or abscess without bleeding: Principal | ICD-10-CM | POA: Diagnosis present

## 2023-12-05 LAB — BLOOD CULTURE ID PANEL (REFLEXED) - BCID2

## 2023-12-05 LAB — COMPREHENSIVE METABOLIC PANEL
ALT: 24 U/L (ref 0–44)
AST: 24 U/L (ref 15–41)
Albumin: 3.4 g/dL — ABNORMAL LOW (ref 3.5–5.0)
Alkaline Phosphatase: 76 U/L (ref 38–126)
Anion gap: 11 (ref 5–15)
BUN: 9 mg/dL (ref 8–23)
CO2: 22 mmol/L (ref 22–32)
Calcium: 8.9 mg/dL (ref 8.9–10.3)
Chloride: 102 mmol/L (ref 98–111)
Creatinine, Ser: 0.82 mg/dL (ref 0.44–1.00)
GFR, Estimated: >60 mL/min (ref >60)
Glucose, Bld: 172 mg/dL — ABNORMAL HIGH (ref 70–99)
Potassium: 2.9 mmol/L — ABNORMAL LOW (ref 3.5–5.1)
Sodium: 135 mmol/L (ref 135–145)
Total Bilirubin: 1.2 mg/dL (ref 0.0–1.2)
Total Protein: 6.5 g/dL (ref 6.5–8.1)

## 2023-12-05 LAB — CBG MONITORING, ED: Glucose-Capillary: 173 mg/dL — ABNORMAL HIGH (ref 70–99)

## 2023-12-05 LAB — RESP PANEL BY RT-PCR (RSV, FLU A&B, COVID)  RVPGX2
Influenza A by PCR: NEGATIVE
Influenza B by PCR: NEGATIVE
Resp Syncytial Virus by PCR: NEGATIVE
SARS Coronavirus 2 by RT PCR: NEGATIVE

## 2023-12-05 LAB — CBC WITH DIFFERENTIAL/PLATELET
Abs Immature Granulocytes: 0.03 10*3/uL (ref 0.00–0.07)
Basophils Absolute: 0.1 10*3/uL (ref 0.0–0.1)
Basophils Relative: 1 %
Eosinophils Absolute: 0.1 10*3/uL (ref 0.0–0.5)
Eosinophils Relative: 1 %
HCT: 37.1 % (ref 36.0–46.0)
Hemoglobin: 12.4 g/dL (ref 12.0–15.0)
Immature Granulocytes: 0 %
Lymphocytes Relative: 18 %
Lymphs Abs: 1.4 10*3/uL (ref 0.7–4.0)
MCH: 30.8 pg (ref 26.0–34.0)
MCHC: 33.4 g/dL (ref 30.0–36.0)
MCV: 92.3 fL (ref 80.0–100.0)
Monocytes Absolute: 0.4 10*3/uL (ref 0.1–1.0)
Monocytes Relative: 5 %
Neutro Abs: 5.8 10*3/uL (ref 1.7–7.7)
Neutrophils Relative %: 75 %
Platelets: 283 10*3/uL (ref 150–400)
RBC: 4.02 MIL/uL (ref 3.87–5.11)
RDW: 13.1 % (ref 11.5–15.5)
WBC: 7.8 10*3/uL (ref 4.0–10.5)
nRBC: 0 % (ref 0.0–0.2)

## 2023-12-05 LAB — URINALYSIS, ROUTINE W REFLEX MICROSCOPIC
Bilirubin Urine: NEGATIVE
Glucose, UA: 50 mg/dL — AB
Hgb urine dipstick: NEGATIVE
Ketones, ur: 20 mg/dL — AB
Leukocytes,Ua: NEGATIVE
Nitrite: NEGATIVE
Protein, ur: NEGATIVE mg/dL
Specific Gravity, Urine: 1.015 (ref 1.005–1.030)
pH: 7 (ref 5.0–8.0)

## 2023-12-05 LAB — LACTIC ACID, PLASMA
Lactic Acid, Venous: 1.3 mmol/L (ref 0.5–1.9)
Lactic Acid, Venous: 1.3 mmol/L (ref 0.5–1.9)

## 2023-12-05 LAB — ETHANOL: Alcohol, Ethyl (B): <10 mg/dL (ref <10)

## 2023-12-05 LAB — I-STAT CG4 LACTIC ACID, ED
Lactic Acid, Venous: 2.3 mmol/L (ref 0.5–1.9)
Lactic Acid, Venous: 3 mmol/L (ref 0.5–1.9)

## 2023-12-05 LAB — T4, FREE: Free T4: 0.85 ng/dL (ref 0.61–1.12)

## 2023-12-05 LAB — TSH: TSH: 3.012 u[IU]/mL (ref 0.350–4.500)

## 2023-12-05 MED ORDER — PANTOPRAZOLE SODIUM 20 MG PO TBEC
20.0000 mg | DELAYED_RELEASE_TABLET | Freq: Every day | ORAL | Status: DC
  Administered 2023-12-07: 20 mg via ORAL
  Filled 2023-12-05: qty 1

## 2023-12-05 MED ORDER — SODIUM CHLORIDE 0.9 % IV SOLN
2.0000 g | Freq: Every day | INTRAVENOUS | Status: DC
  Administered 2023-12-05 – 2023-12-07 (×3): 2 g via INTRAVENOUS
  Filled 2023-12-05 (×4): qty 20

## 2023-12-05 MED ORDER — VANCOMYCIN HCL 2000 MG/400ML IV SOLN
2000.0000 mg | Freq: Once | INTRAVENOUS | Status: AC
  Administered 2023-12-05: 2000 mg via INTRAVENOUS
  Filled 2023-12-05: qty 400

## 2023-12-05 MED ORDER — SODIUM CHLORIDE 0.9 % IV SOLN
2.0000 g | Freq: Once | INTRAVENOUS | Status: AC
  Administered 2023-12-05: 2 g via INTRAVENOUS
  Filled 2023-12-05: qty 12.5

## 2023-12-05 MED ORDER — POTASSIUM CHLORIDE 10 MEQ/100ML IV SOLN
10.0000 meq | INTRAVENOUS | Status: AC
Start: 2023-12-05 — End: 2023-12-05
  Administered 2023-12-05 (×2): 10 meq via INTRAVENOUS
  Filled 2023-12-05 (×2): qty 100

## 2023-12-05 MED ORDER — LACTATED RINGERS IV SOLN: INTRAVENOUS | Status: AC

## 2023-12-05 MED ORDER — ONDANSETRON HCL 4 MG PO TABS: 4.0000 mg | ORAL_TABLET | Freq: Four times a day (QID) | ORAL | Status: DC | PRN

## 2023-12-05 MED ORDER — METRONIDAZOLE 500 MG/100ML IV SOLN
500.0000 mg | Freq: Once | INTRAVENOUS | Status: AC
  Administered 2023-12-05: 500 mg via INTRAVENOUS
  Filled 2023-12-05: qty 100

## 2023-12-05 MED ORDER — METRONIDAZOLE 500 MG/100ML IV SOLN: 500.0000 mg | Freq: Two times a day (BID) | INTRAVENOUS | Status: DC

## 2023-12-05 MED ORDER — ONDANSETRON HCL 4 MG/2ML IJ SOLN
4.0000 mg | Freq: Once | INTRAMUSCULAR | Status: AC
  Filled 2023-12-05: qty 2

## 2023-12-05 MED ORDER — ONDANSETRON HCL 4 MG/2ML IJ SOLN
4.0000 mg | Freq: Four times a day (QID) | INTRAMUSCULAR | Status: DC | PRN
  Administered 2023-12-06 (×2): 4 mg via INTRAVENOUS
  Filled 2023-12-05 (×2): qty 2

## 2023-12-05 MED ORDER — METRONIDAZOLE 500 MG/100ML IV SOLN
500.0000 mg | Freq: Two times a day (BID) | INTRAVENOUS | Status: DC
  Administered 2023-12-05 – 2023-12-07 (×4): 500 mg via INTRAVENOUS
  Filled 2023-12-05 (×4): qty 100

## 2023-12-05 MED ORDER — ALBUTEROL SULFATE (2.5 MG/3ML) 0.083% IN NEBU: 2.5000 mg | INHALATION_SOLUTION | Freq: Four times a day (QID) | RESPIRATORY_TRACT | Status: DC | PRN

## 2023-12-05 MED ORDER — IOHEXOL 350 MG/ML SOLN
75.0000 mL | Freq: Once | INTRAVENOUS | Status: AC | PRN
  Administered 2023-12-05: 75 mL via INTRAVENOUS

## 2023-12-05 MED ORDER — VANCOMYCIN HCL IN DEXTROSE 1-5 GM/200ML-% IV SOLN: 1000.0000 mg | Freq: Once | INTRAVENOUS | Status: DC

## 2023-12-05 MED ORDER — ENOXAPARIN SODIUM 40 MG/0.4ML IJ SOSY
40.0000 mg | PREFILLED_SYRINGE | Freq: Every day | INTRAMUSCULAR | Status: DC
  Administered 2023-12-05 – 2023-12-06 (×2): 40 mg via SUBCUTANEOUS
  Filled 2023-12-05 (×2): qty 0.4

## 2023-12-05 MED ORDER — LOSARTAN POTASSIUM 50 MG PO TABS
50.0000 mg | ORAL_TABLET | Freq: Every day | ORAL | Status: DC
  Administered 2023-12-07: 50 mg via ORAL
  Filled 2023-12-05: qty 1

## 2023-12-05 MED ORDER — ACETAMINOPHEN 650 MG RE SUPP: 650.0000 mg | Freq: Four times a day (QID) | RECTAL | Status: DC | PRN

## 2023-12-05 MED ORDER — MORPHINE SULFATE (PF) 2 MG/ML IV SOLN
2.0000 mg | INTRAVENOUS | Status: DC | PRN
  Administered 2023-12-06: 2 mg via INTRAVENOUS
  Filled 2023-12-05: qty 1

## 2023-12-05 MED ORDER — ONDANSETRON HCL 4 MG/2ML IJ SOLN
INTRAMUSCULAR | Status: AC
  Administered 2023-12-05: 4 mg via INTRAVENOUS
  Filled 2023-12-05: qty 2

## 2023-12-05 MED ORDER — HYALURONIDASE HUMAN 150 UNIT/ML IJ SOLN
150.0000 [IU] | Freq: Once | INTRAMUSCULAR | Status: AC
  Administered 2023-12-05: 150 [IU] via SUBCUTANEOUS
  Filled 2023-12-05: qty 1

## 2023-12-05 MED ORDER — ACETAMINOPHEN 325 MG PO TABS: 650.0000 mg | ORAL_TABLET | Freq: Four times a day (QID) | ORAL | Status: DC | PRN

## 2023-12-05 MED ORDER — SODIUM CHLORIDE 0.9% FLUSH
3.0000 mL | Freq: Two times a day (BID) | INTRAVENOUS | Status: DC
  Administered 2023-12-05 – 2023-12-07 (×2): 3 mL via INTRAVENOUS

## 2023-12-05 NOTE — Progress Notes (Signed)
PHARMACY - PHYSICIAN COMMUNICATION CRITICAL VALUE ALERT - BLOOD CULTURE IDENTIFICATION (BCID)  Jennifer Novak is an 83 y.o. female who presented to [facility] on 12/05/2023 with a chief complaint of AMS/hypothermic. CT scan showed diverticulitis   Assessment:  1/4 blood cultures staph epi with no gene resistance  Name of physician (or Provider) Contacted: Dr. Janalyn Shy  Current antibiotics: ceftriaxone 2g IV every 24 hours + Flagyl 500mg  IV every 12 hours   Changes to prescribed antibiotics recommended:   -Continue current course  Results for orders placed or performed during the hospital encounter of 12/05/23  Blood Culture ID Panel (Reflexed) (Collected: 12/05/2023  3:47 AM)  Result Value Ref Range   Enterococcus faecalis NOT DETECTED NOT DETECTED   Enterococcus Faecium NOT DETECTED NOT DETECTED   Listeria monocytogenes NOT DETECTED NOT DETECTED   Staphylococcus species DETECTED (A) NOT DETECTED   Staphylococcus aureus (BCID) NOT DETECTED NOT DETECTED   Staphylococcus epidermidis DETECTED (A) NOT DETECTED   Staphylococcus lugdunensis NOT DETECTED NOT DETECTED   Streptococcus species NOT DETECTED NOT DETECTED   Streptococcus agalactiae NOT DETECTED NOT DETECTED   Streptococcus pneumoniae NOT DETECTED NOT DETECTED   Streptococcus pyogenes NOT DETECTED NOT DETECTED   A.calcoaceticus-baumannii NOT DETECTED NOT DETECTED   Bacteroides fragilis NOT DETECTED NOT DETECTED   Enterobacterales NOT DETECTED NOT DETECTED   Enterobacter cloacae complex NOT DETECTED NOT DETECTED   Escherichia coli NOT DETECTED NOT DETECTED   Klebsiella aerogenes NOT DETECTED NOT DETECTED   Klebsiella oxytoca NOT DETECTED NOT DETECTED   Klebsiella pneumoniae NOT DETECTED NOT DETECTED   Proteus species NOT DETECTED NOT DETECTED   Salmonella species NOT DETECTED NOT DETECTED   Serratia marcescens NOT DETECTED NOT DETECTED   Haemophilus influenzae NOT DETECTED NOT DETECTED   Neisseria meningitidis NOT DETECTED NOT  DETECTED   Pseudomonas aeruginosa NOT DETECTED NOT DETECTED   Stenotrophomonas maltophilia NOT DETECTED NOT DETECTED   Candida albicans NOT DETECTED NOT DETECTED   Candida auris NOT DETECTED NOT DETECTED   Candida glabrata NOT DETECTED NOT DETECTED   Candida krusei NOT DETECTED NOT DETECTED   Candida parapsilosis NOT DETECTED NOT DETECTED   Candida tropicalis NOT DETECTED NOT DETECTED   Cryptococcus neoformans/gattii NOT DETECTED NOT DETECTED   Methicillin resistance mecA/C NOT DETECTED NOT DETECTED    Arabella Merles, PharmD. Clinical Pharmacist 12/05/2023 11:42 PM
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
## 2023-12-05 NOTE — ED Notes (Signed)
Pt has been cleaned up from 2 encounters of incontinence of stool and urine since arrival. New brief applied, clean bed pad and linens placed. Core rectal temp still low at 95.5

## 2023-12-05 NOTE — ED Notes (Signed)
Pt placed on 2L [place], unsure if pt has hx of sleep apnea, but sats keeps dipping to the mid 80s on RA then will increase back up to the 90s simultaneously. Dr. Wilkie Aye made aware

## 2023-12-05 NOTE — ED Notes (Signed)
Bair hugger placed on pt with several warm blankets applied atop of [employer]

## 2023-12-05 NOTE — Evaluation (Addendum)
Clinical/Bedside Swallow Evaluation Patient Details  Name: Jennifer Novak MRN: [id number] Date of Birth: Feb 05, 1941  Today's Date: 12/05/2023 Time: SLP Start Time (ACUTE ONLY): 1556 SLP Stop Time (ACUTE ONLY): 1610 SLP Time Calculation (min) (ACUTE ONLY): 14 min  Past Medical History:  Past Medical History:  Diagnosis Date   GERD (gastroesophageal reflux disease)    Hypertension    Past Surgical History:  Past Surgical History:  Procedure Laterality Date   ABDOMINAL HYSTERECTOMY     FOOT SURGERY Left    strightened two toes   KNEE ARTHROPLASTY Bilateral    ROTATOR CUFF REPAIR Right    SALIVARY STONE REMOVAL N/A 03/28/2018   Procedure: TRANSORAL SALIVARY STONE REMOVAL/SIALODUCOPLASTY;  Surgeon: Serena Colonel, MD;  Location: [facility] OR;  Service: ENT;  Laterality: N/A;   HPI:  Jennifer Novak is an 83 yo female presenting to ED 1/31 with AMS and n/v. Admitted with concern for sepsis. CXR shows L basilar atelectasis. CT Abdomen/Pelvis shows diverticulitis, moderate to large hiatal hernia, and cholelithiasis. No PMH listed in chart.    Assessment / Plan / Recommendation  Clinical Impression  Pt is lethargic and participation is affected by significant nausea. Her daughter and husband were present throughout the session and report severe reflux. Pt observed with thin liquids and jello without overt s/s aspiration. She had prolonged oral holding with jello, but eventually responded to cueing to initiate a swallow response. Following minimal trials, pt began heaving with concern for regurgitation/emesis. Given oral holding and regurgitation, consider giving meds non-orally. Otherwise, recommend continuing clear liquid diet with full supervision. Pt's daughter demonstrates good awareness of oral holding and swallowing strategies. SLP will f/u briefly, although anticipate as GI symptoms are resolved, pt will progress quickly to baseline diet. SLP Visit Diagnosis: Dysphagia, unspecified (R13.10)     Aspiration Risk  Mild aspiration risk    Diet Recommendation Other (Comment) (clear liquids per GI)    Liquid Administration via: Spoon;Cup;Straw Medication Administration: Via alternative means Supervision: Staff to assist with self feeding;Full supervision/cueing for compensatory strategies Compensations: Minimize environmental distractions;Slow rate;Small sips/bites Postural Changes: Seated upright at 90 degrees;Remain upright for at least 30 minutes after po intake    Other  Recommendations Recommended Consults: Consider GI evaluation;Consider esophageal assessment Oral Care Recommendations: Oral care BID    Recommendations for follow up therapy are one component of a multi-disciplinary discharge planning process, led by the attending physician.  Recommendations may be updated based on patient status, additional functional criteria and insurance authorization.  Follow up Recommendations No SLP follow up      Assistance Recommended at Discharge    Functional Status Assessment Patient has had a recent decline in their functional status and demonstrates the ability to make significant improvements in function in a reasonable and predictable amount of time.  Frequency and Duration min 2x/week  1 week       Prognosis Prognosis for improved oropharyngeal function: Good Barriers to Reach Goals: Time post onset      Swallow Study   General HPI: Jennifer Novak is an 83 yo female presenting to ED 1/31 with AMS and n/v. Admitted with concern for sepsis. CXR shows L basilar atelectasis. CT Abdomen/Pelvis shows diverticulitis, moderate to large hiatal hernia, and cholelithiasis. No PMH listed in chart. Type of Study: Bedside Swallow Evaluation Previous Swallow Assessment: none in chart Diet Prior to this Study: Clear liquid diet Temperature Spikes Noted: No Respiratory Status: Room air History of Recent Intubation: No Behavior/Cognition: Lethargic/Drowsy;Requires cueing Oral Cavity  Assessment: Within Functional Limits Oral Care Completed by SLP: No Oral Cavity - Dentition: Adequate natural dentition Vision: Functional for self-feeding Self-Feeding Abilities: Total assist Patient Positioning: Upright in bed Baseline Vocal Quality: Normal Volitional Cough: Strong Volitional Swallow: Able to elicit    Oral/Motor/Sensory Function Overall Oral Motor/Sensory Function: Within functional limits   Ice Chips Ice chips: Not tested   Thin Liquid Thin Liquid: Within functional limits Presentation: Straw    Nectar Thick Nectar Thick Liquid: Not tested   Honey Thick Honey Thick Liquid: Not tested   Puree Puree: Impaired Presentation: Spoon Oral Phase Impairments: Poor awareness of bolus Oral Phase Functional Implications: Oral holding   Solid     Solid: Not tested      Gwynneth Aliment, M.A., CF-SLP Speech Language Pathology, Acute Rehabilitation Services  Secure Chat preferred [phone]  12/05/2023,4:47 PM

## 2023-12-05 NOTE — ED Provider Notes (Signed)
Received patient in turnover from Dr. Wilkie Aye.  Please see their note for further details of Hx, PE.  Briefly patient is a 83 y.o. female with a Altered Mental Status .  Patient found to be hypothermic.  Altered.  Lactate 3.  No obvious source on initial evaluation.  Plan for CT imaging and then admission.  CT scan is resulted with diverticulitis.  Patient was already given broad-spectrum antibiotics.  With hypothermia added on thyroid studies which are normal.  Will discuss with medicine for admission.    Melene Plan, DO 12/05/23 [phone]
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
## 2023-12-05 NOTE — Sepsis Progress Note (Addendum)
Elink monitoring for the code sepsis protocol.   0500: Notified bedside RN of need to draw/send LA  0630: Notified provider of need for order of 3rd LA

## 2023-12-05 NOTE — ED Notes (Signed)
 Patient transported to CT
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
## 2023-12-05 NOTE — ED Notes (Signed)
 Patient transported to CT with this RN

## 2023-12-05 NOTE — ED Notes (Signed)
ISTAT lactic 3.0, ISTAT sat on rocker for 45 mins before lab ran results, will pull another to re-check

## 2023-12-05 NOTE — ED Notes (Signed)
 Pt returned from CT scan.
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
## 2023-12-06 DIAGNOSIS — M1990 Unspecified osteoarthritis, unspecified site: Secondary | ICD-10-CM | POA: Diagnosis present

## 2023-12-06 DIAGNOSIS — K5792 Diverticulitis of intestine, part unspecified, without perforation or abscess without bleeding: Secondary | ICD-10-CM | POA: Diagnosis not present

## 2023-12-06 DIAGNOSIS — I10 Essential (primary) hypertension: Secondary | ICD-10-CM | POA: Diagnosis present

## 2023-12-06 DIAGNOSIS — G9341 Metabolic encephalopathy: Secondary | ICD-10-CM | POA: Diagnosis present

## 2023-12-06 DIAGNOSIS — E669 Obesity, unspecified: Secondary | ICD-10-CM | POA: Diagnosis present

## 2023-12-06 DIAGNOSIS — H9190 Unspecified hearing loss, unspecified ear: Secondary | ICD-10-CM

## 2023-12-06 DIAGNOSIS — K219 Gastro-esophageal reflux disease without esophagitis: Secondary | ICD-10-CM | POA: Diagnosis present

## 2023-12-06 DIAGNOSIS — E876 Hypokalemia: Secondary | ICD-10-CM | POA: Diagnosis present

## 2023-12-06 LAB — URINE CULTURE: Culture: NO GROWTH

## 2023-12-06 LAB — CBC
HCT: 34.2 % — ABNORMAL LOW (ref 36.0–46.0)
Hemoglobin: 11.5 g/dL — ABNORMAL LOW (ref 12.0–15.0)
MCH: 30.9 pg (ref 26.0–34.0)
MCHC: 33.6 g/dL (ref 30.0–36.0)
MCV: 91.9 fL (ref 80.0–100.0)
Platelets: 256 10*3/uL (ref 150–400)
RBC: 3.72 MIL/uL — ABNORMAL LOW (ref 3.87–5.11)
RDW: 13.3 % (ref 11.5–15.5)
WBC: 5.5 10*3/uL (ref 4.0–10.5)
nRBC: 0 % (ref 0.0–0.2)

## 2023-12-06 LAB — COMPREHENSIVE METABOLIC PANEL
ALT: 18 U/L (ref 0–44)
AST: 16 U/L (ref 15–41)
Albumin: 3 g/dL — ABNORMAL LOW (ref 3.5–5.0)
Alkaline Phosphatase: 66 U/L (ref 38–126)
Anion gap: 10 (ref 5–15)
BUN: <5 mg/dL — ABNORMAL LOW (ref 8–23)
CO2: 22 mmol/L (ref 22–32)
Calcium: 8.7 mg/dL — ABNORMAL LOW (ref 8.9–10.3)
Chloride: 107 mmol/L (ref 98–111)
Creatinine, Ser: 0.7 mg/dL (ref 0.44–1.00)
GFR, Estimated: >60 mL/min (ref >60)
Glucose, Bld: 102 mg/dL — ABNORMAL HIGH (ref 70–99)
Potassium: 3 mmol/L — ABNORMAL LOW (ref 3.5–5.1)
Sodium: 139 mmol/L (ref 135–145)
Total Bilirubin: 0.9 mg/dL (ref 0.0–1.2)
Total Protein: 6 g/dL — ABNORMAL LOW (ref 6.5–8.1)

## 2023-12-06 LAB — POTASSIUM: Potassium: 3.1 mmol/L — ABNORMAL LOW (ref 3.5–5.1)

## 2023-12-06 LAB — MAGNESIUM: Magnesium: 1.7 mg/dL (ref 1.7–2.4)

## 2023-12-06 MED ORDER — ACETAMINOPHEN 160 MG/5ML PO SOLN
650.0000 mg | Freq: Four times a day (QID) | ORAL | Status: DC | PRN
  Administered 2023-12-06 – 2023-12-07 (×3): 650 mg via ORAL
  Filled 2023-12-06 (×3): qty 20.3

## 2023-12-06 MED ORDER — DICLOFENAC SODIUM 1 % EX GEL: 2.0000 g | Freq: Four times a day (QID) | CUTANEOUS | Status: DC | PRN

## 2023-12-06 MED ORDER — ESTRADIOL 0.1 MG/24HR TD PTWK
0.1000 mg | MEDICATED_PATCH | TRANSDERMAL | Status: DC
Start: 2023-12-08 — End: 2023-12-07

## 2023-12-06 MED ORDER — POTASSIUM CHLORIDE CRYS ER 20 MEQ PO TBCR: 40.0000 meq | EXTENDED_RELEASE_TABLET | ORAL | Status: DC

## 2023-12-06 MED ORDER — POTASSIUM CHLORIDE 20 MEQ PO PACK
40.0000 meq | PACK | ORAL | Status: AC
  Administered 2023-12-06 – 2023-12-07 (×2): 40 meq via ORAL
  Filled 2023-12-06 (×2): qty 2

## 2023-12-06 MED ORDER — ACETAMINOPHEN 650 MG RE SUPP: 650.0000 mg | Freq: Four times a day (QID) | RECTAL | Status: DC | PRN

## 2023-12-06 MED ORDER — POTASSIUM CHLORIDE 20 MEQ PO PACK
40.0000 meq | PACK | Freq: Once | ORAL | Status: AC
  Administered 2023-12-06: 40 meq via ORAL
  Filled 2023-12-06: qty 2

## 2023-12-06 NOTE — Progress Notes (Signed)
Pharmacy informed that 1/4 blood cultures growing staph epi with no gene resistance.  Seems like that patient has likely contaminated blood cultures. However I am planning to continue ceftriaxone and Flagyl as of now. Per chart review patient has been admitted for sepsis secondary to sigmoid diverticulitis.   Tereasa Coop, MD [facility] 12/06/2023, 12:09 AM

## 2023-12-06 NOTE — Hospital Course (Signed)
Jennifer Novak is a 83 y.o. female with a history of GERD, hypertension.  Patient presented secondary to altered mental status and patient was found to have sigmoid diverticulitis. Empiric antibiotics started.

## 2023-12-06 NOTE — Plan of Care (Signed)
  Problem: Pain Managment: Goal: General experience of comfort will improve and/or be controlled 12/06/2023 0012 by Sammuel Cooper, RN Outcome: Progressing 12/05/2023 2348 by Sammuel Cooper, RN Outcome: Progressing   Problem: Safety: Goal: Ability to remain free from injury will improve 12/06/2023 0012 by Sammuel Cooper, RN Outcome: Progressing 12/05/2023 2348 by Sammuel Cooper, RN Outcome: Progressing

## 2023-12-06 NOTE — Evaluation (Addendum)
Physical Therapy Evaluation Patient Details Name: Jennifer Novak MRN: [id number] DOB: 1941/07/06 Today's Date: 12/06/2023  History of Present Illness  83 yo female admitted 1/31 with AMS and vomiting. Pt with sepsis presumed due to diverticulitis and encephalopathy. PMHx: cochlear implants, bil TKA, HTN, GERD  Clinical Impression  Pt pleasant, HOH and remains confused regarding events leading to admission and demonstrates some delays in problem solving and awareness which spouse reports is not baseline. Pt with baseline gait deficits with furniture walking and educated for benefit and recommendation of RW for all gait with family in agreement. Pt near baseline functional status with 24hr supervision recommended until cognition completely returns to baseline as well as installation of hand rail on stairs. Pt with decreased cognition, safety and balance who will benefit from acute therapy to maximize independence. Encouraged OOB and ambulation with staff acutely.         If plan is discharge home, recommend the following: Assistance with cooking/housework;Assist for transportation;Help with stairs or ramp for entrance   Can travel by private vehicle        Equipment Recommendations Rolling walker (2 wheels)  Recommendations for Other Services       Functional Status Assessment Patient has had a recent decline in their functional status and/or demonstrates limited ability to make significant improvements in function in a reasonable and predictable amount of time     Precautions / Restrictions Precautions Precautions: Fall      Mobility  Bed Mobility Overal bed mobility: Modified Independent             General bed mobility comments: pt able to exit and enter bed with HOB 10 degrees without physical assist    Transfers Overall transfer level: Modified independent                 General transfer comment: from bed and toilet    Ambulation/Gait Ambulation/Gait  assistance: Supervision Gait Distance (Feet): 300 Feet Assistive device: Rolling walker (2 wheels) Gait Pattern/deviations: Step-through pattern, Decreased stride length   Gait velocity interpretation: >2.62 ft/sec, indicative of community ambulatory   General Gait Details: cues for direction and proximity to [employer] Mobility     Tilt Bed    Modified Rankin (Stroke Patients Only)       Balance Overall balance assessment: Needs assistance   Sitting balance-Leahy Scale: Good     Standing balance support: Bilateral upper extremity supported Standing balance-Leahy Scale: Fair Standing balance comment: pt able to static stand to wash hands with dynamic movement automatically reaching out for environmental support, benefits from RW support at all times                             Pertinent Vitals/Pain Pain Assessment Pain Assessment: No/denies pain    Home Living Family/patient expects to be discharged to:: Private residence Living Arrangements: Spouse/significant other Available Help at Discharge: Family;Available 24 hours/day Type of Home: House Home Access: Stairs to enter   [employer] of Steps: 2   Home Layout: One level Home Equipment: Cane - single point      Prior Function Prior Level of Function : Independent/Modified Independent             Mobility Comments: pt furniture walks in house and cane outside ADLs Comments: housekeeper for cleaning and she and spouse eat out for most meals  Extremity/Trunk Assessment   Upper Extremity Assessment Upper Extremity Assessment: Generalized weakness    Lower Extremity Assessment Lower Extremity Assessment: Generalized weakness    Cervical / Trunk Assessment Cervical / Trunk Assessment: Kyphotic  Communication   Communication Communication: Hearing impairment (cochlear implant left and hearing aid right) Cueing Techniques: Verbal cues;Visual cues   Cognition Arousal: Alert Behavior During Therapy: WFL for tasks assessed/performed Overall Cognitive Status: Impaired/Different from baseline Area of Impairment: Memory, Attention, Following commands                   Current Attention Level: Sustained Memory: Decreased short-term memory Following Commands: Follows one step commands inconsistently       General Comments: pt with difficulty determining right from left, needed cues to pull down brief to use the toilet, min cues to maintain position in RW, does not recall event leading to admission        General Comments      Exercises     Assessment/Plan    PT Assessment Patient needs continued PT services  PT Problem List Decreased activity tolerance;Decreased balance;Decreased mobility;Decreased safety awareness;Decreased knowledge of use of DME       PT Treatment Interventions DME instruction;Gait training;Stair training;Functional mobility training;Patient/family education;Therapeutic activities    PT Goals (Current goals can be found in the Care Plan section)  Acute Rehab PT Goals Patient Stated Goal: go home PT Goal Formulation: With patient/family Time For Goal Achievement: 12/13/23 Potential to Achieve Goals: Good    Frequency Min 1X/week     Co-evaluation               AM-PAC PT "6 Clicks" Mobility  Outcome Measure Help needed turning from your back to your side while in a flat bed without using bedrails?: None Help needed moving from lying on your back to sitting on the side of a flat bed without using bedrails?: None Help needed moving to and from a bed to a chair (including a wheelchair)?: None Help needed standing up from a chair using your arms (e.g., wheelchair or bedside chair)?: None Help needed to walk in hospital room?: A Little Help needed climbing 3-5 steps with a railing? : A Little 6 Click Score: 22    End of Session   Activity Tolerance: Patient tolerated treatment  well Patient left: in bed;with call bell/phone within reach;with family/visitor present Nurse Communication: Mobility status PT Visit Diagnosis: Other abnormalities of gait and mobility (R26.89)    Time: 4332-9518 PT Time Calculation (min) (ACUTE ONLY): 24 min   Charges:   PT Evaluation $PT Eval Moderate Complexity: 1 Mod PT Treatments $Gait Training: 8-22 mins PT General Charges $$ ACUTE PT VISIT: 1 Visit         Merryl Hacker, PT Acute Rehabilitation Services Office: [phone]   Jennifer Novak 12/06/2023, 1:57 PM

## 2023-12-06 NOTE — Plan of Care (Signed)

## 2023-12-06 NOTE — Progress Notes (Signed)
PROGRESS NOTE    Jennifer Novak  VWU:[id number] DOB: 13-Dec-1940 DOA: 12/05/2023 PCP: Robert Bellow, MD   Brief Narrative: Jennifer Novak is a 83 y.o. female with a history of GERD, hypertension.  Patient presented secondary to altered mental status and patient was found to have sigmoid diverticulitis. Empiric antibiotics started.   Assessment and Plan:  Sigmoid diverticulitis Does not appear to have met sepsis criteria. Diverticulitis noted on CT imaging. Patient with associated LLQ abdominal pain. Patient started empirically on Ceftriaxone and Flagyl. No associated leukocytosis. -Continue Ceftriaxone and Flagyl  Acute metabolic encephalopathy Present on admission. Presumed secondary to infection. Patient is back to baseline.  Positive blood culture 1/4 collections significant for staphylococcus epidermidis; likely contaminant.   Hypokalemia Mild. -Continue potassium supplementation  Primary hypertension Patient is on losartan and hydrochlorothiazide as an outpatient. 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  GERD -Continue Protonix  Aortic atherosclerosis Noted on CT scan.  Obesity Estimated body mass index is 30.41 kg/m as calculated from the following:   Height as of this encounter: 5\' 8"  (1.727 m).   Weight as of this encounter: 90.7 kg.   DVT prophylaxis: Lovenox Code Status:   Code Status: Full Code Family Communication: Husband at bedside Disposition Plan: Discharge home likely in 1-2 days pending blood culture results and transition to oral antibiotics   Consultants:  None  Procedures:  None  Antimicrobials: Ceftriaxone Flagyl    Subjective: Patient reports a headache and LLQ abdominal pain.  Objective: BP (!) 145/65 (BP Location: Right Arm)   Pulse (!) 59   Temp 97.6 F (36.4 C) (Oral)   Resp 16   Ht 5\' 8"  (1.727 m)   Wt 90.7 kg    SpO2 100%   BMI 30.41 kg/m   Examination:  General exam: Appears calm and comfortable Respiratory system: Clear to auscultation. Respiratory effort normal. Cardiovascular system: S1 & S2 heard, RRR. No murmurs, rubs, gallops or clicks. Gastrointestinal system: Abdomen is nondistended, soft and mildly tender in LLQ.  Normal bowel sounds heard. Central nervous system: Alert and oriented. No focal neurological deficits. Musculoskeletal: No edema. No calf tenderness Psychiatry: Judgement and insight appear normal. Mood & affect appropriate.    Data Reviewed: I have personally reviewed following labs and imaging studies  CBC Lab Results  Component Value Date   WBC 5.5 12/06/2023   RBC 3.72 (L) 12/06/2023   HGB 11.5 (L) 12/06/2023   HCT 34.2 (L) 12/06/2023   MCV 91.9 12/06/2023   MCH 30.9 12/06/2023   PLT 256 12/06/2023   MCHC 33.6 12/06/2023   RDW 13.3 12/06/2023   LYMPHSABS 1.4 12/05/2023   MONOABS 0.4 12/05/2023   EOSABS 0.1 12/05/2023   BASOSABS 0.1 12/05/2023     Last metabolic panel Lab Results  Component Value Date   NA 139 12/06/2023   K 3.0 (L) 12/06/2023   CL 107 12/06/2023   CO2 22 12/06/2023   BUN <5 (L) 12/06/2023   CREATININE 0.70 12/06/2023   GLUCOSE 102 (H) 12/06/2023   GFRNONAA >60 12/06/2023   GFRAA >60 03/27/2018   CALCIUM 8.7 (L) 12/06/2023   PROT 6.0 (L) 12/06/2023   ALBUMIN 3.0 (L) 12/06/2023   BILITOT 0.9 12/06/2023   ALKPHOS 66 12/06/2023   AST 16 12/06/2023   ALT 18 12/06/2023   ANIONGAP 10 12/06/2023    GFR: Estimated Creatinine Clearance: 63.9 mL/min (by C-G formula based on  SCr of 0.7 mg/dL).  Recent Results (from the past 240 hours)  Blood culture (routine x 2)     Status: None (Preliminary result)   Collection Time: 12/05/23  3:47 AM   Specimen: BLOOD  Result Value Ref Range Status   Specimen Description BLOOD LEFT ANTECUBITAL  Final   Special Requests   Final    BOTTLES DRAWN AEROBIC AND ANAEROBIC Blood Culture adequate  volume   Culture  Setup Time   Final    GRAM POSITIVE COCCI ANAEROBIC BOTTLE ONLY CRITICAL RESULT CALLED TO, READ BACK BY AND VERIFIED WITH: J WYLAND,PHARMD@[email or web address]  MK Performed at [facility], [address], [place], [place] [address]    Culture GRAM POSITIVE COCCI  Final   Report Status PENDING  Incomplete  Blood Culture ID Panel (Reflexed)     Status: Abnormal   Collection Time: 12/05/23  3:47 AM  Result Value Ref Range Status   Enterococcus faecalis NOT DETECTED NOT DETECTED Final   Enterococcus Faecium NOT DETECTED NOT DETECTED Final   Listeria monocytogenes NOT DETECTED NOT DETECTED Final   Staphylococcus species DETECTED (A) NOT DETECTED Final    Comment: CRITICAL RESULT CALLED TO, READ BACK BY AND VERIFIED WITH: J WYLAND,PHARMD@2329  12/05/23 MK    Staphylococcus aureus (BCID) NOT DETECTED NOT DETECTED Final   Staphylococcus epidermidis DETECTED (A) NOT DETECTED Final    Comment: CRITICAL RESULT CALLED TO, READ BACK BY AND VERIFIED WITH: J WYLAND,PHARMD@2329  12/05/23 MK    Staphylococcus lugdunensis NOT DETECTED NOT DETECTED Final   Streptococcus species NOT DETECTED NOT DETECTED Final   Streptococcus agalactiae NOT DETECTED NOT DETECTED Final   Streptococcus pneumoniae NOT DETECTED NOT DETECTED Final   Streptococcus pyogenes NOT DETECTED NOT DETECTED Final   A.calcoaceticus-baumannii NOT DETECTED NOT DETECTED Final   Bacteroides fragilis NOT DETECTED NOT DETECTED Final   Enterobacterales NOT DETECTED NOT DETECTED Final   Enterobacter cloacae complex NOT DETECTED NOT DETECTED Final   Escherichia coli NOT DETECTED NOT DETECTED Final   Klebsiella aerogenes NOT DETECTED NOT DETECTED Final   Klebsiella oxytoca NOT DETECTED NOT DETECTED Final   Klebsiella pneumoniae NOT DETECTED NOT DETECTED Final   Proteus species NOT DETECTED NOT DETECTED Final   Salmonella species NOT DETECTED NOT DETECTED Final   Serratia marcescens NOT DETECTED NOT DETECTED Final   Haemophilus  influenzae NOT DETECTED NOT DETECTED Final   Neisseria meningitidis NOT DETECTED NOT DETECTED Final   Pseudomonas aeruginosa NOT DETECTED NOT DETECTED Final   Stenotrophomonas maltophilia NOT DETECTED NOT DETECTED Final   Candida albicans NOT DETECTED NOT DETECTED Final   Candida auris NOT DETECTED NOT DETECTED Final   Candida glabrata NOT DETECTED NOT DETECTED Final   Candida krusei NOT DETECTED NOT DETECTED Final   Candida parapsilosis NOT DETECTED NOT DETECTED Final   Candida tropicalis NOT DETECTED NOT DETECTED Final   Cryptococcus neoformans/gattii NOT DETECTED NOT DETECTED Final   Methicillin resistance mecA/C NOT DETECTED NOT DETECTED Final    Comment: Performed at [facility], [address], [place], [place] [address]  Resp panel by RT-PCR (RSV, Flu A&B, Covid) Anterior Nasal Swab     Status: None   Collection Time: 12/05/23  3:49 AM   Specimen: Anterior Nasal Swab  Result Value Ref Range Status   SARS Coronavirus 2 by RT PCR NEGATIVE NEGATIVE Final   Influenza A by PCR NEGATIVE NEGATIVE Final   Influenza B by PCR NEGATIVE NEGATIVE Final    Comment: (NOTE) The Xpert Xpress SARS-CoV-2/FLU/RSV plus assay  is intended as an aid in the diagnosis of influenza from Nasopharyngeal swab specimens and should not be used as a sole basis for treatment. Nasal washings and aspirates are unacceptable for Xpert Xpress SARS-CoV-2/FLU/RSV testing.  Fact Sheet for Patients: BloggerCourse.com  Fact Sheet for Healthcare Providers: SeriousBroker.it  This test is not yet approved or cleared by the [place] FDA and has been authorized for detection and/or diagnosis of SARS-CoV-2 by FDA under an Emergency Use Authorization (EUA). This EUA will remain in effect (meaning this test can be used) for the duration of the COVID-19 declaration under Section 564(b)(1) of the Act, 21 U.S.C. section 360bbb-3(b)(1), unless the authorization is  terminated or revoked.     Resp Syncytial Virus by PCR NEGATIVE NEGATIVE Final    Comment: (NOTE) Fact Sheet for Patients: BloggerCourse.com  Fact Sheet for Healthcare Providers: SeriousBroker.it  This test is not yet approved or cleared by the [place] FDA and has been authorized for detection and/or diagnosis of SARS-CoV-2 by FDA under an Emergency Use Authorization (EUA). This EUA will remain in effect (meaning this test can be used) for the duration of the COVID-19 declaration under Section 564(b)(1) of the Act, 21 U.S.C. section 360bbb-3(b)(1), unless the authorization is terminated or revoked.  Performed at [facility], [address], [place], [place] [address]   Blood culture (routine x 2)     Status: None (Preliminary result)   Collection Time: 12/05/23  4:07 AM   Specimen: BLOOD LEFT HAND  Result Value Ref Range Status   Specimen Description BLOOD LEFT HAND  Final   Special Requests   Final    BOTTLES DRAWN AEROBIC AND ANAEROBIC Blood Culture adequate volume   Culture   Final    NO GROWTH 1 DAY Performed at [facility], [address], [place], [place] [address]    Report Status PENDING  Incomplete      Radiology Studies: CT ABDOMEN PELVIS W CONTRAST Result Date: 12/05/2023 CLINICAL DATA:  Sepsis EXAM: CT ABDOMEN AND PELVIS WITH CONTRAST TECHNIQUE: Multidetector CT imaging of the abdomen and pelvis was performed using the standard protocol following bolus administration of intravenous contrast. RADIATION DOSE REDUCTION: This exam was performed according to the departmental dose-optimization program which includes automated exposure control, adjustment of the mA and/or kV according to patient size and/or use of iterative reconstruction technique. CONTRAST:  75mL OMNIPAQUE IOHEXOL 350 MG/ML SOLN COMPARISON:  None Available. FINDINGS: Lower chest: Mild hypoventilatory changes at the dependent lung  bases. Coronary atherosclerosis. Hepatobiliary: Normal liver size. No liver mass. Cholelithiasis. No gallbladder wall thickening. No gallbladder distention. No pericholecystic fluid. No biliary ductal dilatation. Pancreas: Normal, with no mass or duct dilation. Spleen: Normal size. No mass. Adrenals/Urinary Tract: Normal adrenals. Normal kidneys with no hydronephrosis and no renal mass. Normal bladder. Stomach/Bowel: Moderate to large hiatal hernia. Stomach is nondistended and otherwise normal. Normal caliber small bowel with no small bowel wall thickening. Appendix not discretely visualized. Marked diffuse colonic diverticulosis. Mild colonic wall thickening with associated mild pericolonic fat stranding in the proximal sigmoid colon compatible with acute sigmoid diverticulitis. No pericolonic free air or abscess. Vascular/Lymphatic: Atherosclerotic nonaneurysmal abdominal aorta. Patent portal, splenic, hepatic and renal veins. No pathologically enlarged lymph nodes in the abdomen or pelvis. Reproductive: Status post hysterectomy, with no abnormal findings at the vaginal cuff. No adnexal mass. Other: No pneumoperitoneum, ascites or focal fluid collection. Musculoskeletal: No aggressive appearing focal osseous lesions. Marked thoracolumbar spondylosis. IMPRESSION: 1. Acute uncomplicated sigmoid diverticulitis.  2. Moderate to large hiatal hernia. 3. Cholelithiasis. 4. Coronary atherosclerosis. 5.  Aortic Atherosclerosis (ICD10-I70.0). Electronically Signed   By: Delbert Phenix M.D.   On: 12/05/2023 08:48   CT Head Wo Contrast Result Date: 12/05/2023 CLINICAL DATA:  Mental status change with unknown cause. EXAM: CT HEAD WITHOUT CONTRAST TECHNIQUE: Contiguous axial images were obtained from the base of the skull through the vertex without intravenous contrast. RADIATION DOSE REDUCTION: This exam was performed according to the departmental dose-optimization program which includes automated exposure control, adjustment  of the mA and/or kV according to patient size and/or use of iterative reconstruction technique. COMPARISON:  None Available. FINDINGS: Brain: No evidence of acute infarction, hemorrhage, hydrocephalus, extra-axial collection or mass lesion/mass effect. Low-density in the cerebral white matter that is extensive, usually chronic small vessel ischemia. Mild for age cerebral volume loss. Unavoidable streak artifact from metal hardware. Vascular: No hyperdense vessel or unexpected calcification. Skull: Normal. Negative for fracture or focal lesion. Sinuses/Orbits: Cochlear implant on the left and hearing aid on the right. Unavoidable streak artifact. Unremarkable left mastoidectomy site. IMPRESSION: 1. No acute finding. 2. 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 3. 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 Electronically Signed   By: Tiburcio Pea M.D.   On: 12/05/2023 04:37   DG Chest Portable 1 View Result Date: 12/05/2023 CLINICAL DATA:  Altered mental status EXAM: PORTABLE CHEST 1 VIEW COMPARISON:  None Available. FINDINGS: Cardiac shadow is prominent but accentuated by the portable technique. Lungs are hypoinflated. Patchy atelectatic changes are noted in the left base. No bony abnormality is noted. IMPRESSION: Left basilar atelectasis. Electronically Signed   By: Alcide Clever M.D.   On: 12/05/2023 03:47      LOS: 1 day    Jacquelin Hawking, MD [facility] 12/06/2023, 12:20 PM   If 7PM-7AM, please contact night-coverage [email or web address]

## 2023-12-06 NOTE — Progress Notes (Signed)
Speech Language Pathology Treatment: Dysphagia  Patient Details Name: Jennifer Novak MRN: [id number] DOB: 10/21/1941 Today's Date: 12/06/2023 Time: 2952-8413 SLP Time Calculation (min) (ACUTE ONLY): 8 min  Assessment / Plan / Recommendation Clinical Impression  Pt's mentation has improved significantly since previous date. She fed herself trials of thin liquids and jello without overt s/s of dysphagia or aspiration. Oral holding has resolved. Recommend upgrading to regular diet with thin liquids when able to do so from a GI standpoint. No further SLP f/u is needed, will s/o.   HPI HPI: Jennifer Novak is an 83 yo female presenting to ED 1/31 with AMS and n/v. Admitted with concern for sepsis. CXR shows L basilar atelectasis. CT Abdomen/Pelvis shows diverticulitis, moderate to large hiatal hernia, and cholelithiasis. No PMH listed in chart.      SLP Plan  All goals met      Recommendations for follow up therapy are one component of a multi-disciplinary discharge planning process, led by the attending physician.  Recommendations may be updated based on patient status, additional functional criteria and insurance authorization.    Recommendations  Diet recommendations: Regular;Thin liquid Liquids provided via: Cup;Straw Medication Administration: Whole meds with liquid Supervision: Patient able to self feed Compensations: Minimize environmental distractions;Slow rate;Small sips/bites Postural Changes and/or Swallow Maneuvers: Seated upright 90 degrees;Upright 30-60 min after meal                  Oral care BID   PRN Dysphagia, unspecified (R13.10)     All goals met     Gwynneth Aliment, M.A., CF-SLP Speech Language Pathology, Acute Rehabilitation Services  Secure Chat preferred [phone]   12/06/2023, 9:20 AM

## 2023-12-07 DIAGNOSIS — K5792 Diverticulitis of intestine, part unspecified, without perforation or abscess without bleeding: Secondary | ICD-10-CM | POA: Diagnosis not present

## 2023-12-07 LAB — BASIC METABOLIC PANEL
Anion gap: 9 (ref 5–15)
BUN: <5 mg/dL — ABNORMAL LOW (ref 8–23)
CO2: 23 mmol/L (ref 22–32)
Calcium: 8.6 mg/dL — ABNORMAL LOW (ref 8.9–10.3)
Chloride: 104 mmol/L (ref 98–111)
Creatinine, Ser: 0.74 mg/dL (ref 0.44–1.00)
GFR, Estimated: >60 mL/min (ref >60)
Glucose, Bld: 130 mg/dL — ABNORMAL HIGH (ref 70–99)
Potassium: 3.4 mmol/L — ABNORMAL LOW (ref 3.5–5.1)
Sodium: 136 mmol/L (ref 135–145)

## 2023-12-07 LAB — MAGNESIUM: Magnesium: 1.8 mg/dL (ref 1.7–2.4)

## 2023-12-07 MED ORDER — POTASSIUM CHLORIDE CRYS ER 20 MEQ PO TBCR: 20.0000 meq | EXTENDED_RELEASE_TABLET | Freq: Every day | ORAL | 0 refills | Status: DC

## 2023-12-07 MED ORDER — METRONIDAZOLE 500 MG PO TABS
500.0000 mg | ORAL_TABLET | Freq: Two times a day (BID) | ORAL | 0 refills | Status: AC
Start: 2023-12-07 — End: 2023-12-13

## 2023-12-07 MED ORDER — CEFADROXIL 500 MG PO CAPS: 500.0000 mg | ORAL_CAPSULE | Freq: Two times a day (BID) | ORAL | 0 refills | Status: AC

## 2023-12-07 MED ORDER — POTASSIUM CHLORIDE 20 MEQ PO PACK
40.0000 meq | PACK | ORAL | Status: DC
  Administered 2023-12-07: 40 meq via ORAL
  Filled 2023-12-07: qty 2

## 2023-12-07 NOTE — TOC Transition Note (Signed)
Transition of Care [facility]) - Discharge Note   Patient Details  Name: Jennifer Novak MRN: [id number] Date of Birth: [birth year]  [facility] CM/SW Contact:  Ronny Bacon, RN Phone Number: 12/07/2023, 11:24 AM   Clinical Narrative:   Patient is being discharged today. Secure chat from floor nurse and OT that patient needed RW. RW arranged through [facility] with [employer] to be delivered to bedside.    Final next level of care: Home/Self Care Barriers to Discharge: No Barriers Identified   Patient Goals and CMS Choice            Discharge Placement                       Discharge Plan and Services Additional resources added to the After Visit Summary for                  DME Arranged: Walker rolling DME Agency: [employer] Date DME Agency Contacted: 12/07/23 Time DME Agency Contacted: [id number] Representative spoke with at DME Agency: Vaughan Basta            Social Drivers of Health (SDOH) Interventions SDOH Screenings   Food Insecurity: No Food Insecurity (12/06/2023)  Housing: Low Risk  (12/06/2023)  Transportation Needs: No Transportation Needs (12/06/2023)  Utilities: Not At Risk (12/06/2023)  Social Connections: Unknown (12/07/2023)  Tobacco Use: Medium Risk (12/05/2023)     Readmission Risk Interventions     No data to display

## 2023-12-07 NOTE — Discharge Summary (Signed)
Physician Discharge Summary   Patient: Jennifer Novak MRN: [id number] DOB: 1941/04/01  Admit date:     12/05/2023  Discharge date: 12/07/23  Discharge Physician: Jacquelin Hawking, MD   PCP: Robert Bellow, MD   Recommendations at discharge:  PCP visit for hospital follow-up Recommend GI referral for evaluation and management of chronic dysphagia  Discharge Diagnoses: Principal Problem:   Diverticulitis Active Problems:   Acute metabolic encephalopathy   Hypokalemia   Essential hypertension   Arthritis pain   Hard of hearing   GERD (gastroesophageal reflux disease)   Obesity (BMI 30-39.9)  Resolved Problems:   * No resolved hospital problems. [facility] Course: Jennifer Novak is a 83 y.o. female with a history of GERD, hypertension.  Patient presented secondary to altered mental status and patient was found to have sigmoid diverticulitis. Empiric antibiotics started with improvement of symptoms.  Assessment and Plan:  Sigmoid diverticulitis Does not appear to have met sepsis criteria. Diverticulitis noted on CT imaging. Patient with associated LLQ abdominal pain. Patient started empirically on Ceftriaxone and Flagyl. No associated leukocytosis. Discharge on cefadroxil and flagyl.   Acute metabolic encephalopathy Present on admission. Presumed secondary to infection. Patient is back to baseline.   Positive blood culture 1/4 collections significant for staphylococcus epidermidis; likely contaminant.    Hypokalemia Mild. Chronic issue. Continue potassium supplementation.   Primary hypertension Patient is on losartan and hydrochlorothiazide as an outpatient. Losartan resumed on admission. Continue outpatient regimen.   Arthritis Noted. Continue outpatient analgesics.   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  Chronic dysphagia No new symptoms. Recommend patient follows with GI as an outpatient.   GERD Continue Protonix.   Aortic atherosclerosis Noted on CT  scan.   Obesity Estimated body mass index is 30.41 kg/m as calculated from the following:   Height as of this encounter: 5\' 8"  (1.727 m).   Weight as of this encounter: 90.7 kg.  Consultants:  None   Procedures:  None  Disposition: Home Diet recommendation: Soft diet   DISCHARGE MEDICATION: Allergies as of 12/07/2023       Reactions   Penicillins Dermatitis, Rash   Has patient had a PCN reaction causing immediate rash, facial/tongue/throat swelling, SOB or lightheadedness with hypotension: Yes Has patient had a PCN reaction causing severe rash involving mucus membranes or skin necrosis: Yes Has patient had a PCN reaction that required hospitalization: No Has patient had a PCN reaction occurring within the last 10 years: No If all of the above answers are "NO", then may proceed with Cephalosporin use.   Clindamycin/lincomycin Diarrhea   Codeine Nausea And Vomiting   Other reaction(s): Vomiting (intolerance)   Egg-derived Products    Hydrocodone    Sulfa Antibiotics         Medication List     TAKE these medications    cefadroxil 500 MG capsule Commonly known as: DURICEF Take 1 capsule (500 mg total) by mouth 2 (two) times daily for 6 days.   cholecalciferol 1000 units tablet Commonly known as: VITAMIN D Take 1,000 Units by mouth daily.   diclofenac sodium 1 % Gel Commonly known as: VOLTAREN Apply 1 application topically as needed for pain.   estradiol 0.1 mg/24hr patch Commonly known as: CLIMARA - Dosed in mg/24 hr Place 1 patch onto the skin once a week.   hydrochlorothiazide 25 MG tablet Commonly known as: HYDRODIURIL Take 25 mg by mouth daily.   losartan 50 MG tablet Commonly known as: COZAAR Take 50  mg by mouth daily.   metroNIDAZOLE 500 MG tablet Commonly known as: Flagyl Take 1 tablet (500 mg total) by mouth 2 (two) times daily for 6 days.   pantoprazole 20 MG tablet Commonly known as: PROTONIX Take 20 mg by mouth daily.   potassium  chloride SA 20 MEQ tablet Commonly known as: KLOR-CON M Take 1 tablet (20 mEq total) by mouth daily for 7 days.   Vitamin B-12 1000 MCG Subl Place 1,000 mcg under the tongue daily.               Durable Medical Equipment  (From admission, onward)           Start     Ordered   12/07/23 1116  For home use only DME Walker rolling  Once       Question Answer Comment  Walker: With 5 Inch Wheels   Patient needs a walker to treat with the following condition Weakness      12/07/23 1116            Follow-up Information     Rice, Carollee Leitz, MD. Schedule an appointment as soon as possible for a visit in 1 week(s).   Specialty: Internal Medicine Why: For hospital follow-up Contact information: [id number] [place] [address] [phone]                Discharge Exam: BP (!) 142/85 (BP Location: Left Arm)   Pulse (!) 56   Temp 97.9 F (36.6 C) (Oral)   Resp 15   Ht 5\' 8"  (1.727 m)   Wt 90.7 kg   SpO2 99%   BMI 30.41 kg/m   General exam: Appears calm and comfortable Respiratory system: Clear to auscultation. Respiratory effort normal. Cardiovascular system: S1 & S2 heard, RRR. No murmurs Gastrointestinal system: Abdomen is nondistended, soft and nontender. Normal bowel sounds heard. Central nervous system: Alert and oriented. No focal neurological deficits. Psychiatry: Judgement and insight appear normal. Mood & affect appropriate.   Condition at discharge: stable  The results of significant diagnostics from this hospitalization (including imaging, microbiology, ancillary and laboratory) are listed below for reference.   Imaging Studies: CT ABDOMEN PELVIS W CONTRAST Result Date: 12/05/2023 CLINICAL DATA:  Sepsis EXAM: CT ABDOMEN AND PELVIS WITH CONTRAST TECHNIQUE: Multidetector CT imaging of the abdomen and pelvis was performed using the standard protocol following bolus administration of intravenous contrast. RADIATION DOSE REDUCTION:  This exam was performed according to the departmental dose-optimization program which includes automated exposure control, adjustment of the mA and/or kV according to patient size and/or use of iterative reconstruction technique. CONTRAST:  75mL OMNIPAQUE IOHEXOL 350 MG/ML SOLN COMPARISON:  None Available. FINDINGS: Lower chest: Mild hypoventilatory changes at the dependent lung bases. Coronary atherosclerosis. Hepatobiliary: Normal liver size. No liver mass. Cholelithiasis. No gallbladder wall thickening. No gallbladder distention. No pericholecystic fluid. No biliary ductal dilatation. Pancreas: Normal, with no mass or duct dilation. Spleen: Normal size. No mass. Adrenals/Urinary Tract: Normal adrenals. Normal kidneys with no hydronephrosis and no renal mass. Normal bladder. Stomach/Bowel: Moderate to large hiatal hernia. Stomach is nondistended and otherwise normal. Normal caliber small bowel with no small bowel wall thickening. Appendix not discretely visualized. Marked diffuse colonic diverticulosis. Mild colonic wall thickening with associated mild pericolonic fat stranding in the proximal sigmoid colon compatible with acute sigmoid diverticulitis. No pericolonic free air or abscess. Vascular/Lymphatic: Atherosclerotic nonaneurysmal abdominal aorta. Patent portal, splenic, hepatic and renal veins. No pathologically enlarged lymph nodes in the abdomen  or pelvis. Reproductive: Status post hysterectomy, with no abnormal findings at the vaginal cuff. No adnexal mass. Other: No pneumoperitoneum, ascites or focal fluid collection. Musculoskeletal: No aggressive appearing focal osseous lesions. Marked thoracolumbar spondylosis. IMPRESSION: 1. Acute uncomplicated sigmoid diverticulitis. 2. Moderate to large hiatal hernia. 3. Cholelithiasis. 4. Coronary atherosclerosis. 5.  Aortic Atherosclerosis (ICD10-I70.0). Electronically Signed   By: Delbert Phenix M.D.   On: 12/05/2023 08:48   CT Head Wo Contrast Result Date:  12/05/2023 CLINICAL DATA:  Mental status change with unknown cause. EXAM: CT HEAD WITHOUT CONTRAST TECHNIQUE: Contiguous axial images were obtained from the base of the skull through the vertex without intravenous contrast. RADIATION DOSE REDUCTION: This exam was performed according to the departmental dose-optimization program which includes automated exposure control, adjustment of the mA and/or kV according to patient size and/or use of iterative reconstruction technique. COMPARISON:  None Available. FINDINGS: Brain: No evidence of acute infarction, hemorrhage, hydrocephalus, extra-axial collection or mass lesion/mass effect. Low-density in the cerebral white matter that is extensive, usually chronic small vessel ischemia. Mild for age cerebral volume loss. Unavoidable streak artifact from metal hardware. Vascular: No hyperdense vessel or unexpected calcification. Skull: Normal. Negative for fracture or focal lesion. Sinuses/Orbits: Cochlear implant on the left and hearing aid on the right. Unavoidable streak artifact. Unremarkable left mastoidectomy site. IMPRESSION: 1. No acute finding. 2. 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 3. 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 Electronically Signed   By: Tiburcio Pea M.D.   On: 12/05/2023 04:37   DG Chest Portable 1 View Result Date: 12/05/2023 CLINICAL DATA:  Altered mental status EXAM: PORTABLE CHEST 1 VIEW COMPARISON:  None Available. FINDINGS: Cardiac shadow is prominent but accentuated by the portable technique. Lungs are hypoinflated. Patchy atelectatic changes are noted in the left base. No bony abnormality is noted. IMPRESSION: Left basilar atelectasis. Electronically Signed   By: Alcide Clever M.D.   On: 12/05/2023 03:47    Microbiology: Results for orders placed or performed during the hospital encounter of 12/05/23  Urine Culture     Status: None   Collection Time: 12/05/23  3:32 AM   Specimen: Urine, Catheterized   Result Value Ref Range Status   Specimen Description URINE, CATHETERIZED  Final   Special Requests NONE  Final   Culture   Final    NO GROWTH Performed at [facility], [address], [place], [place] [address]    Report Status 12/06/2023 FINAL  Final  Blood culture (routine x 2)     Status: None (Preliminary result)   Collection Time: 12/05/23  3:47 AM   Specimen: BLOOD  Result Value Ref Range Status   Specimen Description BLOOD LEFT ANTECUBITAL  Final   Special Requests   Final    BOTTLES DRAWN AEROBIC AND ANAEROBIC Blood Culture adequate volume   Culture  Setup Time   Final    GRAM POSITIVE COCCI ANAEROBIC BOTTLE ONLY CRITICAL RESULT CALLED TO, READ BACK BY AND VERIFIED WITH: Delynn Flavin  MK Performed at [facility], [address], [place], [place] [address]    Culture GRAM POSITIVE COCCI  Final   Report Status PENDING  Incomplete  Blood Culture ID Panel (Reflexed)     Status: Abnormal   Collection Time: 12/05/23  3:47 AM  Result Value Ref Range Status   Enterococcus faecalis NOT DETECTED NOT DETECTED Final   Enterococcus Faecium NOT DETECTED NOT DETECTED Final   Listeria monocytogenes NOT DETECTED NOT DETECTED Final  Staphylococcus species DETECTED (A) NOT DETECTED Final    Comment: CRITICAL RESULT CALLED TO, READ BACK BY AND VERIFIED WITH: J WYLAND,PHARMD@[email or web address]  12/05/23 MK    Staphylococcus aureus (BCID) NOT DETECTED NOT DETECTED Final   Staphylococcus epidermidis DETECTED (A) NOT DETECTED Final    Comment: CRITICAL RESULT CALLED TO, READ BACK BY AND VERIFIED WITH: J WYLAND,PHARMD@2329  12/05/23 MK    Staphylococcus lugdunensis NOT DETECTED NOT DETECTED Final   Streptococcus species NOT DETECTED NOT DETECTED Final   Streptococcus agalactiae NOT DETECTED NOT DETECTED Final   Streptococcus pneumoniae NOT DETECTED NOT DETECTED Final   Streptococcus pyogenes NOT DETECTED NOT DETECTED Final   A.calcoaceticus-baumannii NOT DETECTED NOT DETECTED Final    Bacteroides fragilis NOT DETECTED NOT DETECTED Final   Enterobacterales NOT DETECTED NOT DETECTED Final   Enterobacter cloacae complex NOT DETECTED NOT DETECTED Final   Escherichia coli NOT DETECTED NOT DETECTED Final   Klebsiella aerogenes NOT DETECTED NOT DETECTED Final   Klebsiella oxytoca NOT DETECTED NOT DETECTED Final   Klebsiella pneumoniae NOT DETECTED NOT DETECTED Final   Proteus species NOT DETECTED NOT DETECTED Final   Salmonella species NOT DETECTED NOT DETECTED Final   Serratia marcescens NOT DETECTED NOT DETECTED Final   Haemophilus influenzae NOT DETECTED NOT DETECTED Final   Neisseria meningitidis NOT DETECTED NOT DETECTED Final   Pseudomonas aeruginosa NOT DETECTED NOT DETECTED Final   Stenotrophomonas maltophilia NOT DETECTED NOT DETECTED Final   Candida albicans NOT DETECTED NOT DETECTED Final   Candida auris NOT DETECTED NOT DETECTED Final   Candida glabrata NOT DETECTED NOT DETECTED Final   Candida krusei NOT DETECTED NOT DETECTED Final   Candida parapsilosis NOT DETECTED NOT DETECTED Final   Candida tropicalis NOT DETECTED NOT DETECTED Final   Cryptococcus neoformans/gattii NOT DETECTED NOT DETECTED Final   Methicillin resistance mecA/C NOT DETECTED NOT DETECTED Final    Comment: Performed at [facility], [address], [place], [place] [address]  Resp panel by RT-PCR (RSV, Flu A&B, Covid) Anterior Nasal Swab     Status: None   Collection Time: 12/05/23  3:49 AM   Specimen: Anterior Nasal Swab  Result Value Ref Range Status   SARS Coronavirus 2 by RT PCR NEGATIVE NEGATIVE Final   Influenza A by PCR NEGATIVE NEGATIVE Final   Influenza B by PCR NEGATIVE NEGATIVE Final    Comment: (NOTE) The Xpert Xpress SARS-CoV-2/FLU/RSV plus assay is intended as an aid in the diagnosis of influenza from Nasopharyngeal swab specimens and should not be used as a sole basis for treatment. Nasal washings and aspirates are unacceptable for Xpert Xpress  SARS-CoV-2/FLU/RSV testing.  Fact Sheet for Patients: BloggerCourse.com  Fact Sheet for Healthcare Providers: SeriousBroker.it  This test is not yet approved or cleared by the [place] FDA and has been authorized for detection and/or diagnosis of SARS-CoV-2 by FDA under an Emergency Use Authorization (EUA). This EUA will remain in effect (meaning this test can be used) for the duration of the COVID-19 declaration under Section 564(b)(1) of the Act, 21 U.S.C. section 360bbb-3(b)(1), unless the authorization is terminated or revoked.     Resp Syncytial Virus by PCR NEGATIVE NEGATIVE Final    Comment: (NOTE) Fact Sheet for Patients: BloggerCourse.com  Fact Sheet for Healthcare Providers: SeriousBroker.it  This test is not yet approved or cleared by the [place] FDA and has been authorized for detection and/or diagnosis of SARS-CoV-2 by FDA under an Emergency Use Authorization (EUA). This EUA will remain in effect (meaning this test  can be used) for the duration of the COVID-19 declaration under Section 564(b)(1) of the Act, 21 U.S.C. section 360bbb-3(b)(1), unless the authorization is terminated or revoked.  Performed at [facility], [address] [place], [place] [address]   Blood culture (routine x 2)     Status: None (Preliminary result)   Collection Time: 12/05/23  4:07 AM   Specimen: BLOOD LEFT HAND  Result Value Ref Range Status   Specimen Description BLOOD LEFT HAND  Final   Special Requests   Final    BOTTLES DRAWN AEROBIC AND ANAEROBIC Blood Culture adequate volume   Culture   Final    NO GROWTH 2 DAYS Performed at [facility], [address], [place], [place] [address]    Report Status PENDING  Incomplete    Labs: CBC: Recent Labs  Lab 12/05/23 0332 12/06/23 0601  WBC 7.8 5.5  NEUTROABS 5.8  --   HGB 12.4 11.5*  HCT 37.1 34.2*   MCV 92.3 91.9  PLT 283 256   Basic Metabolic Panel: Recent Labs  Lab 12/05/23 0332 12/06/23 0601 12/06/23 1836 12/07/23 0801  NA 135 139  --  136  K 2.9* 3.0* 3.1* 3.4*  CL 102 107  --  104  CO2 22 22  --  23  GLUCOSE 172* 102*  --  130*  BUN 9 <5*  --  <5*  CREATININE 0.82 0.70  --  0.74  CALCIUM 8.9 8.7*  --  8.6*  MG  --  1.7  --  1.8   Liver Function Tests: Recent Labs  Lab 12/05/23 0332 12/06/23 0601  AST 24 16  ALT 24 18  ALKPHOS 76 66  BILITOT 1.2 0.9  PROT 6.5 6.0*  ALBUMIN 3.4* 3.0*   CBG: Recent Labs  Lab 12/05/23 0328  GLUCAP 173*    Discharge time spent: 35 minutes.  Signed: Jacquelin Hawking, MD [facility] 12/07/2023

## 2023-12-07 NOTE — Progress Notes (Signed)
Mobility Specialist Progress Note:   12/07/23 1159  Mobility  Activity Ambulated with assistance in hallway  Level of Assistance Standby assist, set-up cues, supervision of patient - no hands on  Assistive Device Front wheel walker  Distance Ambulated (ft) 425 ft  Activity Response Tolerated well  Mobility Referral Yes  Mobility visit 1 Mobility  Mobility Specialist Start Time (ACUTE ONLY) 1015  Mobility Specialist Stop Time (ACUTE ONLY) 1030  Mobility Specialist Time Calculation (min) (ACUTE ONLY) 15 min   Pt received in bed, agreeable to mobility. Daughter present and very helpful during session. SB for all OOB activity. Pt c/o slight LLE weakness, otherwise asx throughout. Pt eager to ambulate greater distance this session. VSS throughout. Pt returned to bed with call bell in reach and all needs met.   Jennifer Novak  Mobility Specialist Please contact via [employer] office at [phone]

## 2023-12-07 NOTE — Evaluation (Addendum)
Occupational Therapy Evaluation and Discharge Patient Details Name: Jennifer Novak MRN: [id number] DOB: 1941-05-07 Today's Date: 12/07/2023   History of Present Illness 83 yo female admitted 1/31 with AMS and vomiting. Pt with sepsis presumed due to diverticulitis and encephalopathy. PMHx: cochlear implants, bil TKA, HTN, GERD   Clinical Impression   At baseline, pt is largely Independent to Mod I with ADLs with assist needed for donning/doffing socks and performs functional mobility with furniture walking in the home and a SPC in the community. At baseline, pt has a housekeeper for cleaning and eats most meals out. Pt now presents with mildly decreased activity tolerance and mild noted balance deficits. Pt admitted with AMS. However, pt pt, family, and RN report pt cognition is significantly improved this day with pt cognition also [facility] this session. Pt currently demonstrates ability to complete ADLs at or near baseline with pt demonstrating ability to perform UB ADLs Independent to Supervision and LB ADLs largely with Mod I to Supervision for safety and no cues or physical assist needed. Pt and family report pt is near baseline functional level with family comfortable providing assist at current level as needed. Pt will benefit from a RW to improve safety and independence with functional mobility. No further skilled OT needs or additional equipment needs identified at this time. OT is signing off.     If plan is discharge home, recommend the following: A little help with walking and/or transfers;A little help with bathing/dressing/bathroom;Direct supervision/assist for medications management;Assist for transportation;Help with stairs or ramp for entrance    Functional Status Assessment     Equipment Recommendations  Other (comment) (RW)    Recommendations for Other Services       Precautions / Restrictions Precautions Precautions: Fall Restrictions Weight Bearing Restrictions Per Provider  Order: No      Mobility Bed Mobility Overal bed mobility: Modified Independent             General bed mobility comments: with mildly increased time and no cues or physical assist needed    Transfers Overall transfer level: Modified independent (to Supervision) Equipment used: Rolling walker (2 wheels)               General transfer comment: for STS and step-pivot      Balance Overall balance assessment: Needs assistance Sitting-balance support: No upper extremity supported, Feet supported Sitting balance-Leahy Scale: Good     Standing balance support: Single extremity supported, Bilateral upper extremity supported, During functional activity Standing balance-Leahy Scale: Fair Standing balance comment: pt able to static stand, benefits from RW support at all times                           ADL either performed or assessed with clinical judgement   ADL Overall ADL's : Needs assistance/impaired Eating/Feeding: Independent;Sitting   Grooming: Modified independent;Supervision/safety;Standing   Upper Body Bathing: Modified independent;Sitting   Lower Body Bathing: Supervison/ safety;Sit to/from stand   Upper Body Dressing : Modified independent;Sitting   Lower Body Dressing: Modified independent;Supervision/safety;Sit to/from stand Lower Body Dressing Details (indicate cue type and reason): requires Mod assist for sock, but this is baseline and pt does not typically wear socks Toilet Transfer: Modified Independent;Supervision/safety;Rolling walker (2 wheels);Regular Toilet;Grab bars   Toileting- Clothing Manipulation and Hygiene: Modified independent;Supervision/safety;Sitting/lateral lean;Sit to/from stand       Functional mobility during ADLs: Modified independent;Supervision/safety;Rolling walker (2 wheels) General ADL Comments: Pt near baseline PLOF with pt demonstrating ability  to complete ADLs Independent to Supervision for safety in simulated  tasks with no cues and no physical assist needed.     Vision Patient Visual Report: No change from baseline Vision Assessment?: No apparent visual deficits (during tasks assessed; not formally screened or tested)     Perception         Praxis         Pertinent Vitals/Pain Pain Assessment Pain Assessment: No/denies pain     Extremity/Trunk Assessment Upper Extremity Assessment Upper Extremity Assessment: Right hand dominant;Overall [facility] for tasks assessed   Lower Extremity Assessment Lower Extremity Assessment: Defer to PT evaluation   Cervical / Trunk Assessment Cervical / Trunk Assessment: Kyphotic   Communication Communication Communication: Hearing impairment (cochlear implant left and hearing aid right) Cueing Techniques: Visual cues;Gestural cues (due to [facility])   Cognition Arousal: Alert Behavior During Therapy: WFL for tasks assessed/performed Overall Cognitive Status: Within Functional Limits for tasks assessed                                 General Comments: Pt AAOx4 and pleasant throughout session. Pt able to follow 1 and 2-step instructions consistently. Pt cognition largely [facility] with mildly increased time needed due to processing. However, this may be related to use of cochlear implant/being HOH and not cognition. Pt, family, and RN all reporting pt with significantly improved cognition this day and with pt and family reporting pt is at or very near baseline at this time.     General Comments  Multiple family members present during session, including husband and daughter who provided assist with communication due to pt being [facility] and need for masks to be worn by staff.    Exercises     Shoulder Instructions      Home Living Family/patient expects to be discharged to:: Private residence Living Arrangements: Spouse/significant other Available Help at Discharge: Family;Available 24 hours/day Type of Home: House Home Access: Stairs to  enter [employer] of Steps: 2   Home Layout: One level     Bathroom Shower/Tub: Producer, television/film/video: Handicapped height     Home Equipment: Cane - single point;BSC/3in1;Shower seat          Prior Functioning/Environment Prior Level of Function : Independent/Modified Independent;Needs assist             Mobility Comments: At baseline, pt furniture walks in house and uses [facility] outside. ADLs Comments: At baseline, pt is largely Independent to Mod I with ADLs. Pt and family reporting husband needs to assists with donning/doffing sock, but pt typically wearing slip-on shoes without socks. Pt has a housekeeper for cleaning and she and spouse eat out for most meals.        OT Problem List:        OT Treatment/Interventions:      OT Goals(Current goals can be found in the care plan section) Acute Rehab OT Goals Patient Stated Goal: to return home OT Goal Formulation: With patient/family (daughter and husband)  OT Frequency:      Co-evaluation              AM-PAC OT "6 Clicks" Daily Activity     Outcome Measure Help from another person eating meals?: None Help from another person taking care of personal grooming?: A Little Help from another person toileting, which includes using toliet, bedpan, or urinal?: A Little Help from another person bathing (including washing,  rinsing, drying)?: A Little Help from another person to put on and taking off regular upper body clothing?: None Help from another person to put on and taking off regular lower body clothing?: A Little 6 Click Score: 20   End of Session Nurse Communication: Mobility status;Other (comment) (RW has been order for pt and pt awaiting delivery; No OT follow indicated at this time)  Activity Tolerance: Patient tolerated treatment well Patient left: in bed;with call bell/phone within reach;with family/visitor present  OT Visit Diagnosis: Other (comment) (decreased activity tolerance)                 Time: 1610-9604 OT Time Calculation (min): 17 min Charges:  OT General Charges $OT Visit: 1 Visit OT Evaluation $OT Eval Low Complexity: 1 Low  Brydan Downard "Orson Eva., OTR/L, MA Acute Rehab [phone]   Lendon Colonel 12/07/2023, 11:23 AM

## 2023-12-07 NOTE — Care Management (Cosign Needed)
    Durable Medical Equipment  (From admission, onward)           Start     Ordered   12/07/23 1116  For home use only DME Walker rolling  Once       Question Answer Comment  Walker: With 5 Inch Wheels   Patient needs a walker to treat with the following condition Weakness      12/07/23 1116

## 2023-12-07 NOTE — Discharge Instructions (Signed)
Jennifer Novak,  You were in the hospital with an infection called diverticulitis. This is improved with antibiotics. Please continue antibiotics. Please continue a low fiber diet for the next week and advance to to a high fiber diet. Please follow-up with your primary care physician.

## 2023-12-08 LAB — CULTURE, BLOOD (ROUTINE X 2): Special Requests: ADEQUATE

## 2023-12-10 LAB — CULTURE, BLOOD (ROUTINE X 2)
Culture: NO GROWTH
Special Requests: ADEQUATE

## 2023-12-19 ENCOUNTER — Ambulatory Visit (INDEPENDENT_AMBULATORY_CARE_PROVIDER_SITE_OTHER): Payer: Medicare Other | Admitting: Physician Assistant

## 2023-12-19 ENCOUNTER — Encounter: Payer: Self-pay | Admitting: Physician Assistant

## 2023-12-19 VITALS — BP 141/79 | HR 77 | Temp 98.1°F | Ht 68.0 in | Wt 206.0 lb

## 2023-12-19 DIAGNOSIS — I2510 Atherosclerotic heart disease of native coronary artery without angina pectoris: Secondary | ICD-10-CM | POA: Insufficient documentation

## 2023-12-19 DIAGNOSIS — G9341 Metabolic encephalopathy: Secondary | ICD-10-CM

## 2023-12-19 DIAGNOSIS — I6789 Other cerebrovascular disease: Secondary | ICD-10-CM | POA: Insufficient documentation

## 2023-12-19 DIAGNOSIS — E876 Hypokalemia: Secondary | ICD-10-CM

## 2023-12-19 DIAGNOSIS — I739 Peripheral vascular disease, unspecified: Secondary | ICD-10-CM | POA: Diagnosis not present

## 2023-12-19 DIAGNOSIS — E538 Deficiency of other specified B group vitamins: Secondary | ICD-10-CM | POA: Diagnosis not present

## 2023-12-19 DIAGNOSIS — Z8719 Personal history of other diseases of the digestive system: Secondary | ICD-10-CM | POA: Insufficient documentation

## 2023-12-19 DIAGNOSIS — D649 Anemia, unspecified: Secondary | ICD-10-CM | POA: Insufficient documentation

## 2023-12-19 DIAGNOSIS — K449 Diaphragmatic hernia without obstruction or gangrene: Secondary | ICD-10-CM | POA: Insufficient documentation

## 2023-12-19 DIAGNOSIS — R1319 Other dysphagia: Secondary | ICD-10-CM

## 2023-12-19 DIAGNOSIS — I2583 Coronary atherosclerosis due to lipid rich plaque: Secondary | ICD-10-CM

## 2023-12-19 DIAGNOSIS — I10 Essential (primary) hypertension: Secondary | ICD-10-CM

## 2023-12-19 MED ORDER — CYANOCOBALAMIN 1000 MCG/ML IJ SOLN
1000.0000 ug | Freq: Once | INTRAMUSCULAR | Status: AC
  Administered 2023-12-19: 1000 ug via INTRAMUSCULAR

## 2023-12-19 NOTE — Progress Notes (Signed)
New Patient Office Visit  Subjective    Patient ID: Jennifer Novak, female    DOB: [birth year]  Age: 83 y.o. MRN: [id number]  CC:  Chief Complaint  Patient presents with   Establish Care    Pt was seen in ed 11/29/23   Patient presented secondary to altered mental status and patient was found to have sigmoid diverticulitis. Empiric antibiotics started with improvement of symptoms.Patient started empirically on Ceftriaxone and Flagyl. Discharge on cefadroxil and flagyl, pt daughter would like labs and b12 injection     HPI Jennifer Novak presents to establish care. Her PCP retired. She recently went to hospital (12/05/2023) for altered mental status and found to have diverticulitis. She has completed metronidazole, ceftriaxone and cefadroxil. She has completed both. Suspected infection caused metabolic encephalopathy. Pt's altered mental status has resolved.   She did have a CT of head and abdomen done that showed:  Diverticulitis CAD Hiatal hernia Cholelithiasis Chronic microvascular white matter changes   Outpatient Encounter Medications as of 12/19/2023  Medication Sig   cholecalciferol (VITAMIN D) 1000 units tablet Take 1,000 Units by mouth daily.   Cyanocobalamin (VITAMIN B-12) 1000 MCG SUBL Place 1,000 mcg under the tongue daily.   diclofenac sodium (VOLTAREN) 1 % GEL Apply 1 application topically as needed for pain.   estradiol (CLIMARA - DOSED IN MG/24 HR) 0.1 mg/24hr patch Place 1 patch onto the skin once a week.    hydrochlorothiazide (HYDRODIURIL) 25 MG tablet Take 25 mg by mouth daily.   losartan (COZAAR) 50 MG tablet Take 50 mg by mouth daily.   pantoprazole (PROTONIX) 20 MG tablet Take 20 mg by mouth daily.   [DISCONTINUED] potassium chloride SA (KLOR-CON M) 20 MEQ tablet Take 1 tablet (20 mEq total) by mouth daily for 7 days.   [EXPIRED] cyanocobalamin (VITAMIN B12) injection 1,000 mcg    No facility-administered encounter medications on file as of 12/19/2023.    Past  Medical History:  Diagnosis Date   GERD (gastroesophageal reflux disease)    Hypertension     Past Surgical History:  Procedure Laterality Date   ABDOMINAL HYSTERECTOMY     FOOT SURGERY Left    strightened two toes   KNEE ARTHROPLASTY Bilateral    ROTATOR CUFF REPAIR Right    SALIVARY STONE REMOVAL N/A 03/28/2018   Procedure: TRANSORAL SALIVARY STONE REMOVAL/SIALODUCOPLASTY;  Surgeon: Jennifer Colonel, MD;  Location: [facility] OR;  Service: ENT;  Laterality: N/A;    History reviewed. No pertinent family history.  Social History   Socioeconomic History   Marital status: Married    Spouse name: Not on file   Number of children: Not on file   Years of education: Not on file   Highest education level: Not on file  Occupational History   Not on file  Tobacco Use   Smoking status: Passive Smoke Exposure - Never Smoker   Smokeless tobacco: Never  Vaping Use   Vaping status: Never Used  Substance and Sexual Activity   Alcohol use: Yes    Comment: Wine once in a while   Drug use: Never   Sexual activity: Not on file  Other Topics Concern   Not on file  Social History Narrative   Not on file   Social Drivers of Health   Financial Resource Strain: Not on file  Food Insecurity: No Food Insecurity (12/06/2023)   Hunger Vital Sign    Worried About Running Out of Food in the Last Year: Never true  Ran Out of Food in the Last Year: Never true  Transportation Needs: No Transportation Needs (12/06/2023)   PRAPARE - Administrator, Civil Service (Medical): No    Lack of Transportation (Non-Medical): No  Physical Activity: Not on file  Stress: Not on file  Social Connections: Unknown (12/07/2023)   Social Connection and Isolation Panel [NHANES]    Frequency of Communication with Friends and Family: More than three times a week    Frequency of Social Gatherings with Friends and Family: More than three times a week    Attends Religious Services: Patient unable to answer    Active  Member of Clubs or Organizations: No    Attends Banker Meetings: Never    Marital Status: Married  Catering manager Violence: Not At Risk (12/06/2023)   Humiliation, Afraid, Rape, and Kick questionnaire    Fear of Current or Ex-Partner: No    Emotionally Abused: No    Physically Abused: No    Sexually Abused: No    Review of Systems  All other systems reviewed and are negative.       Objective    BP (!) 141/79   Pulse 77   Temp 98.1 F (36.7 C) (Oral)   Ht 5\' 8"  (1.727 m)   Wt 206 lb (93.4 kg)   SpO2 99%   BMI 31.32 kg/m   Physical Exam Constitutional:      Appearance: Normal appearance.  HENT:     Head: Normocephalic.  Cardiovascular:     Rate and Rhythm: Normal rate and regular rhythm.     Pulses: Normal pulses.     Heart sounds: Normal heart sounds.  Pulmonary:     Effort: Pulmonary effort is normal.     Breath sounds: Normal breath sounds.  Abdominal:     General: There is no distension.     Palpations: Abdomen is soft. There is no mass.     Tenderness: There is no abdominal tenderness. There is no right CVA tenderness, left CVA tenderness, guarding or rebound.     Hernia: No hernia is present.  Musculoskeletal:     Cervical back: Neck supple. No tenderness.     Right lower leg: No edema.     Left lower leg: No edema.  Lymphadenopathy:     Cervical: No cervical adenopathy.  Neurological:     General: No focal deficit present.     Mental Status: She is alert and oriented to person, place, and time.  Psychiatric:        Mood and Affect: Mood normal.        Assessment & Plan:  Marland KitchenMarland KitchenLudella was seen today for establish care.  Diagnoses and all orders for this visit:  History of diverticulitis  Essential hypertension -     Ambulatory referral to Cardiology  PAD (peripheral artery disease) (HCC) -     Lipid panel -     Ambulatory referral to Cardiology  Large hiatal hernia  Hypokalemia -     CMP14+EGFR  Anemia, unspecified  type -     CBC w/Diff/Platelet -     Fe+TIBC+Fer  B12 deficiency -     cyanocobalamin (VITAMIN B12) injection 1,000 mcg -     B12 and Folate Panel  Coronary artery disease due to lipid rich plaque -     Lipid panel -     Ambulatory referral to Cardiology  Cerebral microvascular disease -     Ambulatory referral to Cardiology  Esophageal dysphagia  Acute metabolic encephalopathy   Discussed CT reports from hospital.  -diverticulitis symptoms resolved - altered mental status resolved -microvascular changes/CAD/PVD and patient not on statin will continue to discuss this will get consult with cardiology about stain therapy -ASA 81mg  daily  Will get follow up labs and recheck potassium due to low potassium in hospital B12 shot given today    Tandy Gaw, PA-C

## 2023-12-19 NOTE — Patient Instructions (Signed)
Get labs today Will make referral to Dr. Excell Seltzer

## 2023-12-20 LAB — CBC WITH DIFFERENTIAL/PLATELET
Basophils Absolute: 0.1 10*3/uL (ref 0.0–0.2)
Basos: 1 %
EOS (ABSOLUTE): 0.1 10*3/uL (ref 0.0–0.4)
Eos: 2 %
Hematocrit: 41.5 % (ref 34.0–46.6)
Hemoglobin: 13.8 g/dL (ref 11.1–15.9)
Immature Grans (Abs): 0 10*3/uL (ref 0.0–0.1)
Immature Granulocytes: 0 %
Lymphocytes Absolute: 1.2 10*3/uL (ref 0.7–3.1)
Lymphs: 23 %
MCH: 31.3 pg (ref 26.6–33.0)
MCHC: 33.3 g/dL (ref 31.5–35.7)
MCV: 94 fL (ref 79–97)
Monocytes Absolute: 0.4 10*3/uL (ref 0.1–0.9)
Monocytes: 8 %
Neutrophils Absolute: 3.5 10*3/uL (ref 1.4–7.0)
Neutrophils: 66 %
Platelets: 315 10*3/uL (ref 150–450)
RBC: 4.41 x10E6/uL (ref 3.77–5.28)
RDW: 12.7 % (ref 11.7–15.4)
WBC: 5.3 10*3/uL (ref 3.4–10.8)

## 2023-12-20 LAB — CMP14+EGFR
ALT: 25 [IU]/L (ref 0–32)
AST: 32 [IU]/L (ref 0–40)
Albumin: 4.2 g/dL (ref 3.7–4.7)
Alkaline Phosphatase: 93 [IU]/L (ref 44–121)
BUN/Creatinine Ratio: 10 — ABNORMAL LOW (ref 12–28)
BUN: 8 mg/dL (ref 8–27)
Bilirubin Total: 0.5 mg/dL (ref 0.0–1.2)
CO2: 24 mmol/L (ref 20–29)
Calcium: 9.4 mg/dL (ref 8.7–10.3)
Chloride: 100 mmol/L (ref 96–106)
Creatinine, Ser: 0.77 mg/dL (ref 0.57–1.00)
Globulin, Total: 2.9 g/dL (ref 1.5–4.5)
Glucose: 93 mg/dL (ref 70–99)
Potassium: 4 mmol/L (ref 3.5–5.2)
Sodium: 140 mmol/L (ref 134–144)
Total Protein: 7.1 g/dL (ref 6.0–8.5)
eGFR: 77 mL/min/{1.73_m2} (ref >59)

## 2023-12-20 LAB — IRON,TIBC AND FERRITIN PANEL
Ferritin: 46 ng/mL (ref 15–150)
Iron Saturation: 38 % (ref 15–55)
Iron: 116 ug/dL (ref 27–139)
Total Iron Binding Capacity: 305 ug/dL (ref 250–450)
UIBC: 189 ug/dL (ref 118–369)

## 2023-12-20 LAB — LIPID PANEL
Chol/HDL Ratio: 3.8 {ratio} (ref 0.0–4.4)
Cholesterol, Total: 212 mg/dL — ABNORMAL HIGH (ref 100–199)
HDL: 56 mg/dL (ref >39)
LDL Chol Calc (NIH): 134 mg/dL — ABNORMAL HIGH (ref 0–99)
Triglycerides: 124 mg/dL (ref 0–149)
VLDL Cholesterol Cal: 22 mg/dL (ref 5–40)

## 2023-12-20 LAB — B12 AND FOLATE PANEL
Folate: 5.5 ng/mL (ref >3.0)
Vitamin B-12: 868 pg/mL (ref 232–1245)

## 2023-12-22 ENCOUNTER — Encounter: Payer: Self-pay | Admitting: Physician Assistant

## 2023-12-22 DIAGNOSIS — R1319 Other dysphagia: Secondary | ICD-10-CM | POA: Insufficient documentation

## 2023-12-22 NOTE — Progress Notes (Signed)
Ethelyne,   Kidney function looks good.  Potassium looks good.  Hemoglobin improved and looks good.   HDL, good cholesterol, to goal.  LDL, bad cholesterol, not to goal.  Will defer to cardiology about statin start.

## 2024-01-01 ENCOUNTER — Telehealth: Payer: Self-pay | Admitting: *Deleted

## 2024-01-01 NOTE — Telephone Encounter (Signed)
 S/w pts daughter per (DPR) who handles both parents care.  Pt, Jennifer Novak was in hospital for diverticulitis and PCP stated pt needed to be on a statin. PCP, Tandy Gaw, PA  Referred pt to cardiology. Daughter, if not urgent would like pt to be seen at [facility] by Lebron Conners after April 15, that way the daughter can come to appointment. Marcelino Duster will have to advise. The father, whom has seen Marcelino Duster in the past also would like to come to [facility] to see Eligha Bridegroom, NP and pt is due in May.  If Marcelino Duster could bring both pts back together on the same day in April this would work best. Will send to [place] and get back to daughter tomorrow.

## 2024-01-01 NOTE — Telephone Encounter (Signed)
 I suspect that she is recommended to start a statin due to coronary calcification and aortic atherosclerosis seen on recent CT. I am happy to see her and her husband together or in separate visits, whichever they prefer.  LDL cholesterol goal will be 70 or lower and it is currently 134.

## 2024-01-02 NOTE — Telephone Encounter (Signed)
 S/w pt daughter Elita Quick, per (DPR). Eligha Bridegroom, NP will see both parents at the same time, Jennifer Novak being a new pt.  Ok to wait till after April 15 for appt.. Both appt scheduled together and daughter is aware.

## 2024-01-14 ENCOUNTER — Encounter: Payer: Self-pay | Admitting: Physician Assistant

## 2024-01-16 ENCOUNTER — Other Ambulatory Visit: Payer: Self-pay | Admitting: Physician Assistant

## 2024-01-16 DIAGNOSIS — R4589 Other symptoms and signs involving emotional state: Secondary | ICD-10-CM | POA: Insufficient documentation

## 2024-01-16 MED ORDER — ESCITALOPRAM OXALATE 5 MG PO TABS: 5.0000 mg | ORAL_TABLET | Freq: Every day | ORAL | 1 refills | Status: DC

## 2024-02-19 NOTE — Progress Notes (Signed)
 Cardiology Office Note:  .   Date:  02/23/2024 ID:  Jennifer Novak, DOB [birth year], MRN [id number] PCP: [facility] Providers Cardiologist:  None   Patient Profile: .      PMH Hyperlipidemia Hypertension Diverticulitis Coronary atherosclerosis noted on CT Aortic atherosclerosis       History of Present Illness: .    History of Present Illness Jennifer Novak is a very pleasant 83 y.o. female  who is here today for new patient consult for hyperlipidemia. She is the wife of a current patient and is here today with her daughter and her husband. Her daughter, Oralee Billow, reports that she sleeps an excessive amount of time, has experienced loss of appetite, difficulty walking, and memory issues. She continues to live independently with her husband, but her daughter lives close by and is very involved in their care. Patient confirms that she is not hungry and eats very little. She is not very active and sometimes has difficulty walking. They have encouraged her to walk with a cane. In terms of cognitive function, the patient's daughter reports the patient often starts to tell something and then goes blank. She also mentions an incident in January when the patient was unresponsive and had to be hospitalized. During this hospitalization, the patient's temperature was significantly below normal and her potassium levels were low.  Head CT 12/05/2023 revealed no acute infarction or hemorrhage, chronic small vessel ischemia. she was treated with antibiotics and potassium supplements. The patient does not remember being in the hospital. Patient denies chest pain, shortness of breath, orthopnea, PND, presyncope, syncope.  Reports left leg edema for the past few weeks. She reports weight is stable.  Her family is planning a 2-week trip to [place]  at the end of May.  Family history: Her family history is not on file.   Discussed the use of AI scribe software for clinical note  transcription with the patient, who gave verbal consent to proceed.   No results found for: "LIPOA"    ROS: See HPI       Studies Reviewed: .         CT Abdomen/Pelvis 12/05/23 IMPRESSION: 1. Acute uncomplicated sigmoid diverticulitis. 2. Moderate to large hiatal hernia. 3. Cholelithiasis. 4. Coronary atherosclerosis. 5.  Aortic Atherosclerosis (ICD10-I70.0).  CT Head 12/05/23 IMPRESSION: 1. No acute finding. 2. 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 3. 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  Risk Assessment/Calculations:             Physical Exam:   VS: BP 110/78 (BP Location: Left Arm, Patient Position: Sitting, Cuff Size: Normal)   Pulse 79   Ht 5\' 9"  (1.753 m)   Wt 206 lb 9.6 oz (93.7 kg)   SpO2 97%   BMI 30.51 kg/m   Wt Readings from Last 3 Encounters:  02/23/24 206 lb 9.6 oz (93.7 kg)  12/19/23 206 lb (93.4 kg)  12/05/23 200 lb (90.7 kg)     GEN: Well nourished, well developed in no acute distress NECK: No JVD; No carotid bruits CARDIAC: RRR, no murmurs, rubs, gallops RESPIRATORY:  Clear to auscultation without rales, wheezing or rhonchi  ABDOMEN: Soft, non-tender, non-distended EXTREMITIES:  2+ pitting edema LLE. No deformity     ASSESSMENT AND PLAN: .    Assessment & Plan Leg swelling   She has new onset leg swelling. She denies dyspnea, orthopnea, PND but admits she is not very active. Weight is stable. We will get echocardiogram  to assess heart and valve function.   Coronary artery calcification/Aortic Atherosclerosis Coronary artery calcification and aortic atherosclerosis noted on CT 12/05/2023. She denies chest pain, dyspnea, or other symptoms concerning for angina. We are getting echo to evaluate heart and valve function and will assess for wall motion abnormality. LDL goal < 70, starting rosuvastatin  10 mg daily and will recheck lipids in 2 months.   Hyperlipidemia LDL goal < 70 Lipid panel completed 12/19/2023 with  total cholesterol 212, HDL 56, LDL 134, and triglycerides 124. Target LDL 70 mg/dL or lower due to coronary and aortic calcification noted on CT. Initiate rosuvastatin  10 mg daily and reassess cholesterol levels in two months.  Cognitive decline   Family reports cognitive decline with memory issues and frequent sleeping.  Admission 11/2023 with evaluations that ruled out cerebrovascular accident. Recommendation to see PCP for cognitive testing.  Nutritional concerns   Poor appetite and inadequate nutrition may impact her energy and muscle strength. Improvement in nutritional intake is necessary. Encourage consumption of protein-rich foods and drinks, such as Fairlife chocolate milk or protein shakes. Consider small, frequent meals and easy-to-prepare foods like eggs and yogurt.  Follow-up   Follow-up is required to monitor conditions and treatment response. Coordination of lab work and echocardiogram is essential before travel. Schedule a follow-up appointment post-trip, coordinate lab work and echocardiogram pre-trip, and ensure cognitive testing is scheduled with primary care.     Disposition: 2-3 months with me  Signed, Slater Duncan, NP-C

## 2024-02-23 ENCOUNTER — Encounter (HOSPITAL_BASED_OUTPATIENT_CLINIC_OR_DEPARTMENT_OTHER): Payer: Self-pay | Admitting: Nurse Practitioner

## 2024-02-23 ENCOUNTER — Encounter: Payer: Self-pay | Admitting: Physician Assistant

## 2024-02-23 ENCOUNTER — Ambulatory Visit (INDEPENDENT_AMBULATORY_CARE_PROVIDER_SITE_OTHER): Payer: Medicare Other | Admitting: Nurse Practitioner

## 2024-02-23 VITALS — BP 110/78 | HR 79 | Ht 69.0 in | Wt 206.6 lb

## 2024-02-23 DIAGNOSIS — E785 Hyperlipidemia, unspecified: Secondary | ICD-10-CM | POA: Diagnosis not present

## 2024-02-23 DIAGNOSIS — I700 Atherosclerosis of aorta: Secondary | ICD-10-CM

## 2024-02-23 DIAGNOSIS — E46 Unspecified protein-calorie malnutrition: Secondary | ICD-10-CM

## 2024-02-23 DIAGNOSIS — I2583 Coronary atherosclerosis due to lipid rich plaque: Secondary | ICD-10-CM

## 2024-02-23 DIAGNOSIS — I10 Essential (primary) hypertension: Secondary | ICD-10-CM | POA: Diagnosis not present

## 2024-02-23 DIAGNOSIS — I2510 Atherosclerotic heart disease of native coronary artery without angina pectoris: Secondary | ICD-10-CM | POA: Diagnosis not present

## 2024-02-23 DIAGNOSIS — R4189 Other symptoms and signs involving cognitive functions and awareness: Secondary | ICD-10-CM

## 2024-02-23 DIAGNOSIS — M7989 Other specified soft tissue disorders: Secondary | ICD-10-CM

## 2024-02-23 MED ORDER — ROSUVASTATIN CALCIUM 10 MG PO TABS: 10.0000 mg | ORAL_TABLET | Freq: Every day | ORAL | 3 refills | Status: AC

## 2024-02-23 NOTE — Patient Instructions (Signed)
 Medication Instructions:   START Rosuvastatin  one (1) tablet by mouth ( 10 mg) daily.   *If you need a refill on your cardiac medications before your next appointment, please call your pharmacy*  Lab Work:  Your physician recommends that you return for a FASTING lipid profile fasting after midnight, the week before your appointment with Moira Andrews. Paperwork given to patient today.    If you have labs (blood work) drawn today and your tests are completely normal, you will receive your results only by: MyChart Message (if you have MyChart) OR A paper copy in the mail If you have any lab test that is abnormal or we need to change your treatment, we will call you to review the results.  Testing/Procedures:  Your physician has requested that you have an echocardiogram. Echocardiography is a painless test that uses sound waves to create images of your heart. It provides your doctor with information about the size and shape of your heart and how well your heart's chambers and valves are working. This procedure takes approximately one hour. There are no restrictions for this procedure. Please do NOT wear cologne, perfume, or lotions (deodorant is allowed). Please arrive 15 minutes prior to your appointment time.  Please note: We ask at that you not bring children with you during ultrasound (echo/ vascular) testing. Due to room size and safety concerns, children are not allowed in the ultrasound rooms during exams. Our front office staff cannot provide observation of children in our lobby area while testing is being conducted. An adult accompanying a patient to their appointment will only be allowed in the ultrasound room at the discretion of the ultrasound technician under special circumstances. We apologize for any inconvenience.    Follow-Up: At [facility], you and your health needs are our priority.  As part of our continuing mission to provide you with exceptional heart care, our  providers are all part of one team.  This team includes your primary Cardiologist (physician) and Advanced Practice Providers or APPs (Physician Assistants and Nurse Practitioners) who all work together to provide you with the care you need, when you need it.  Your next appointment:   2 month(s)  Provider:   Slater Duncan, NP    We recommend signing up for the patient portal called "MyChart".  Sign up information is provided on this After Visit Summary.  MyChart is used to connect with patients for Virtual Visits (Telemedicine).  Patients are able to view lab/test results, encounter notes, upcoming appointments, etc.  Non-urgent messages can be sent to your provider as well.   To learn more about what you can do with MyChart, go to [email or web address].

## 2024-03-01 ENCOUNTER — Other Ambulatory Visit: Payer: Self-pay | Admitting: Physician Assistant

## 2024-03-17 ENCOUNTER — Ambulatory Visit (HOSPITAL_BASED_OUTPATIENT_CLINIC_OR_DEPARTMENT_OTHER)

## 2024-03-17 DIAGNOSIS — I2510 Atherosclerotic heart disease of native coronary artery without angina pectoris: Secondary | ICD-10-CM

## 2024-03-17 DIAGNOSIS — I10 Essential (primary) hypertension: Secondary | ICD-10-CM

## 2024-03-17 DIAGNOSIS — I2583 Coronary atherosclerosis due to lipid rich plaque: Secondary | ICD-10-CM

## 2024-03-17 LAB — LIPID PANEL
Chol/HDL Ratio: 2.1 ratio (ref 0.0–4.4)
Cholesterol, Total: 137 mg/dL (ref 100–199)
HDL: 64 mg/dL (ref >39)
LDL Chol Calc (NIH): 59 mg/dL (ref 0–99)
Triglycerides: 70 mg/dL (ref 0–149)
VLDL Cholesterol Cal: 14 mg/dL (ref 5–40)

## 2024-03-17 LAB — ECHOCARDIOGRAM COMPLETE
Area-P 1/2: 3.6 cm2
S' Lateral: 2.62 cm

## 2024-03-17 LAB — ALT: ALT: 32 IU/L (ref 0–32)

## 2024-03-18 ENCOUNTER — Ambulatory Visit (HOSPITAL_BASED_OUTPATIENT_CLINIC_OR_DEPARTMENT_OTHER): Payer: Self-pay | Admitting: Nurse Practitioner

## 2024-03-19 ENCOUNTER — Encounter: Payer: Self-pay | Admitting: Physician Assistant

## 2024-03-19 ENCOUNTER — Ambulatory Visit (INDEPENDENT_AMBULATORY_CARE_PROVIDER_SITE_OTHER): Payer: Medicare Other | Admitting: Physician Assistant

## 2024-03-19 VITALS — BP 132/55 | HR 98 | Ht 69.0 in | Wt 210.0 lb

## 2024-03-19 DIAGNOSIS — F03A3 Unspecified dementia, mild, with mood disturbance: Secondary | ICD-10-CM | POA: Diagnosis not present

## 2024-03-19 DIAGNOSIS — I10 Essential (primary) hypertension: Secondary | ICD-10-CM

## 2024-03-19 DIAGNOSIS — I6789 Other cerebrovascular disease: Secondary | ICD-10-CM | POA: Diagnosis not present

## 2024-03-19 DIAGNOSIS — I2583 Coronary atherosclerosis due to lipid rich plaque: Secondary | ICD-10-CM

## 2024-03-19 DIAGNOSIS — R4589 Other symptoms and signs involving emotional state: Secondary | ICD-10-CM

## 2024-03-19 DIAGNOSIS — I2510 Atherosclerotic heart disease of native coronary artery without angina pectoris: Secondary | ICD-10-CM

## 2024-03-19 MED ORDER — ESCITALOPRAM OXALATE 5 MG PO TABS: 5.0000 mg | ORAL_TABLET | Freq: Every day | ORAL | 1 refills | Status: DC

## 2024-03-19 MED ORDER — DONEPEZIL HCL 5 MG PO TBDP
5.0000 mg | ORAL_TABLET | Freq: Every day | ORAL | 0 refills | Status: DC
Start: 2024-03-19 — End: 2024-05-21

## 2024-03-19 NOTE — Progress Notes (Signed)
 Established Patient Office Visit  Subjective   Patient ID: Jennifer Novak, female    DOB: 12-13-1940  Age: 83 y.o. MRN: [id number]  Chief Complaint  Patient presents with   Medical Management of Chronic Issues    HPI Pt is a 83 yo obese female who presents to the clinic with daughter to discuss memory decline since fall of last year(2024) and depressed mood. Pt established care in feburary after ED visit for diverticulitis that caused mental status decline. She had CT of head that showed microvascular changes. She was sent to cardiology and started on crestor  and ASA. She was also started on lexapro  for mood.   She has done well on lexparo. Her mood is better. Her memory is not better. Her daughter reports she gets easily confused. She has ok long term memory but task like figuring out how to pop her pills out of her medication package are hard and she struggles to communicate her needs and wants. She can be asked what she wants at a resturant and will say "cheese" to mean macaroni and cheese. 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   Patient Active Problem List   Diagnosis Date Noted   Mild dementia with mood disturbance (HCC) 03/19/2024   Depressed mood 01/16/2024   Esophageal dysphagia 12/22/2023   History of diverticulitis 12/19/2023   PAD (peripheral artery disease) (HCC) 12/19/2023   Large hiatal hernia 12/19/2023   Cerebral microvascular disease 12/19/2023   Coronary artery disease due to lipid rich plaque 12/19/2023   B12 deficiency 12/19/2023   Anemia 12/19/2023   Acute metabolic encephalopathy 12/06/2023   Hypokalemia 12/06/2023   Essential hypertension 12/06/2023   Hard of hearing 12/06/2023   Arthritis pain 12/06/2023   GERD (gastroesophageal reflux disease) 12/06/2023   Obesity (BMI 30-39.9) 12/06/2023   Diverticulitis 12/05/2023   Chronic elbow pain, right 10/12/2021   Chronic right shoulder pain  10/12/2021   Past Medical History:  Diagnosis Date   GERD (gastroesophageal reflux disease)    Hypertension    Past Surgical History:  Procedure Laterality Date   ABDOMINAL HYSTERECTOMY     FOOT SURGERY Left    strightened two toes   KNEE ARTHROPLASTY Bilateral    ROTATOR CUFF REPAIR Right    SALIVARY STONE REMOVAL N/A 03/28/2018   Procedure: TRANSORAL SALIVARY STONE REMOVAL/SIALODUCOPLASTY;  Surgeon: Janita Mellow, MD;  Location: [facility] OR;  Service: ENT;  Laterality: N/A;   No family history on file. Allergies  Allergen Reactions   Penicillins Dermatitis and Rash    Has patient had a PCN reaction causing immediate rash, facial/tongue/throat swelling, SOB or lightheadedness with hypotension: Yes Has patient had a PCN reaction causing severe rash involving mucus membranes or skin necrosis: Yes Has patient had a PCN reaction that required hospitalization: No Has patient had a PCN reaction occurring within the last 10 years: No If all of the above answers are "NO", then may proceed with Cephalosporin use.    Clindamycin /Lincomycin Diarrhea   Codeine Nausea And Vomiting    Other reaction(s): Vomiting (intolerance)   Egg-Derived Products    Hydrocodone     Sulfa Antibiotics     ROS See HPI.    Objective:     BP (!) 132/55   Pulse 98   Ht 5\' 9"  (1.753 m)   Wt 210 lb (95.3 kg)   SpO2 99%   BMI 31.01 kg/m  BP Readings from Last 3  Encounters:  03/19/24 (!) 132/55  02/23/24 110/78  12/19/23 (!) 141/79   Wt Readings from Last 3 Encounters:  03/19/24 210 lb (95.3 kg)  02/23/24 206 lb 9.6 oz (93.7 kg)  12/19/23 206 lb (93.4 kg)      Physical Exam Constitutional:      Appearance: Normal appearance. She is obese.  HENT:     Head: Normocephalic.  Cardiovascular:     Rate and Rhythm: Normal rate and regular rhythm.  Pulmonary:     Effort: Pulmonary effort is normal.     Breath sounds: Normal breath sounds.  Musculoskeletal:     Right lower leg: No edema.     Left  lower leg: No edema.  Neurological:     General: No focal deficit present.     Mental Status: She is alert.  Psychiatric:        Mood and Affect: Mood normal.         03/19/2024   12:04 PM 12/19/2023   10:37 AM  Depression screen PHQ 2/9  Decreased Interest 1 3  Down, Depressed, Hopeless 1 2  PHQ - 2 Score 2 5  Altered sleeping 0 3  Tired, decreased energy 1 3  Change in appetite 0 3  Feeling bad or failure about yourself  1 0  Trouble concentrating 1 2  Moving slowly or fidgety/restless 1 3  Suicidal thoughts 0 0  PHQ-9 Score 6 19  Difficult doing work/chores Not difficult at all Not difficult at all       03/19/2024   10:38 AM  Montreal Cognitive Assessment   Visuospatial/ Executive (0/5) 1  Naming (0/3) 3  Attention: Read list of digits (0/2) 0  Attention: Read list of letters (0/1) 0  Attention: Serial 7 subtraction starting at 100 (0/3) 0  Language: Repeat phrase (0/2) 1  Language : Fluency (0/1) 0  Abstraction (0/2) 1  Delayed Recall (0/5) 1  Orientation (0/6) 2  Total 9  Adjusted Score (based on education) 9      Assessment & Plan:  Aaron AasAaron AasIn was seen today for medical management of chronic issues.  Diagnoses and all orders for this visit:  Mild dementia with mood disturbance, unspecified dementia type (HCC) -     donepezil (ARICEPT ODT) 5 MG disintegrating tablet; Take 1 tablet (5 mg total) by mouth at bedtime. -     Ambulatory referral to Neurology  Depressed mood -     escitalopram  (LEXAPRO ) 5 MG tablet; Take 1 tablet (5 mg total) by mouth daily. For mood.  Essential hypertension  Cerebral microvascular disease  Coronary artery disease due to lipid rich plaque  PHQ improved Continue on lexapro , refilled today.  MOCA is very decreased at 9/30 Discussed dementia ? If vascular due to microvascular changes On statin and ASA Contraindication to MRI imaging due to cochlear implant Start aricept at bedtime Discussed side effects Referral made  to neurology Discussed ways to help with memory and thinking Follow up in 2 months    Return in about 2 months (around 05/19/2024).    Valora Norell, PA-C

## 2024-03-19 NOTE — Patient Instructions (Signed)
 Will make referral to neurology.  Start aricept at bedtime.   Problems With Thinking and Memory (Mild Neurocognitive Disorder): What to Know Mild neurocognitive disorder, formerly known as mild cognitive impairment, is a disorder where your memory doesn't work as well as it should. It may also affect other mental abilities like thinking, communicating, behavior, and being able to finish tasks. These problems can be noticed and measured. But they usually don't stop you from doing daily activities or living on your own. Mild neurocognitive disorder usually happens after 83 years of age. But it can also happen at younger ages. It's not as serious as major neurocognitive disorder, also known as dementia, but it may be the first sign of it. In general, the symptoms of this condition get worse over time. In rare cases, symptoms can get better. What are the causes? This condition may be caused by: Brain disorders like Alzheimer's disease, Parkinson's disease, and other conditions that slowly damage nerve cells. Diseases that affect the blood vessels in the brain and cause small strokes. Certain infections, like HIV. Traumatic brain injury. Other medical conditions, such as brain tumors, underactive thyroid (hypothyroidism), and not having enough vitamin B12. Using certain drugs or medicines. What increases the risk? Being older than 83 years of age. Being female. Having a lower level of education. Diabetes, high blood pressure, high cholesterol, and other conditions that raise the risk for blood vessel diseases. Untreated or undertreated sleep apnea. Having a certain type of gene that can be inherited, or passed down from parent to child. Long-term health problems like heart disease, lung disease, liver disease, kidney disease, or depression. What are the signs or symptoms? Trouble remembering things. You may: Forget names, phone numbers, or details of recent events. Forget about social events and  appointments. Often forget where you put your car keys or other items. Trouble thinking and solving problems. You may have trouble with complex tasks like: Paying bills. Driving in places you don't know well. Trouble communicating. You may have trouble: Finding the right word or naming an object. Forming a sentence that makes sense. Understanding what you read or hear. Changes in your behavior or personality. When this happens, you may: Lose interest in the things you used to enjoy. Avoid being around people. Get angry more easily than usual. Act before thinking. How is this diagnosed? This condition is diagnosed based on: Your symptoms. Your health care provider may ask you and the people you spend time with, like family and friends, about your symptoms. Memory tests and other tests to check how your brain is working. Your provider may refer you to a provider called a neurologist or a mental health specialist. To try to find out the cause of your condition, your provider may: Get a detailed medical history. Ask about use of alcohol, drugs, and medicines. Do a physical exam. Order blood tests and brain imaging tests. How is this treated? Mild neurocognitive disorder that's caused by medicine use, drug use, infection, or another medical condition may get better when the cause is treated, or when medicines or drugs are stopped. If this disorder has another cause, it usually doesn't improve and may get worse. In these cases, the goal of treatment is to help you manage the symptoms. This may include: Medicines to help with memory and behavior symptoms. Talk therapy. This provides education, emotional support, memory aids, and other ways of making up for problems with mental tasks. Lifestyle changes. These may include: Getting regular exercise. Eating a healthy  diet that includes omega-3 fatty acids. Doing things to challenge your thinking and memory skills. Spending more time being with  and talking to other people. Using routines like having regular times for meals and going to bed. Follow these instructions at home: Eating and drinking  Drink more fluids as told. Eat a healthy diet that includes omega-3 fatty acids. These can be found in: Fish. Nuts. Leafy vegetables. Vegetable oils. If you drink alcohol: Limit how much you have to: 0-1 drink a day if you're female. 0-2 drinks a day if you're female. Know how much alcohol is in your drink. In the [place] one drink is one 12 oz bottle of beer (355 mL), one 5 oz glass of wine (148 mL), or one 1 oz glass of hard liquor (44 mL). Lifestyle  Get regular exercise as told by your provider. Do not smoke, vape, or use nicotine or tobacco. Use healthy ways to manage stress. If you need help managing stress, ask your provider. Keep spending time with other people. Keep your mind active by doing activities you enjoy, like reading or playing games. Make sure you get good sleep at night. These tips can help: Try not to take naps during the day. Keep your bedroom dark and cool. Do not exercise in the few hours before you go to bed. Do not have foods or drinks with caffeine at night. General instructions Take medicines only as told. Your provider may tell you to avoid taking medicines that can affect thinking. These include some medicines for pain or sleeping. Work with your provider to find out: What things you need help with. What your safety needs are. Where to find more information [employer] on Aging: [employer]pl Contact a health care provider if: You have any new symptoms. Get help right away if: You have new confusion or your confusion gets worse. You act in ways that put you or your family in danger. This information is not intended to replace advice given to you by your health care provider. Make sure you discuss any questions you have with your health care provider. Document Revised: 04/15/2023 Document  Reviewed: 04/15/2023 Elsevier Patient Education  2024 [employer].

## 2024-03-26 ENCOUNTER — Other Ambulatory Visit: Payer: Self-pay | Admitting: Physician Assistant

## 2024-03-26 NOTE — Telephone Encounter (Signed)
Written by historical provider. 

## 2024-04-26 ENCOUNTER — Ambulatory Visit (HOSPITAL_BASED_OUTPATIENT_CLINIC_OR_DEPARTMENT_OTHER): Admitting: Nurse Practitioner

## 2024-04-26 ENCOUNTER — Emergency Department (HOSPITAL_COMMUNITY)
Admission: EM | Admit: 2024-04-26 | Discharge: 2024-04-26 | Disposition: A | Discharge status: Home / Self Care | Attending: Emergency Medicine | Admitting: Emergency Medicine

## 2024-04-26 ENCOUNTER — Encounter (HOSPITAL_COMMUNITY): Payer: Self-pay | Admitting: Emergency Medicine

## 2024-04-26 ENCOUNTER — Encounter (HOSPITAL_BASED_OUTPATIENT_CLINIC_OR_DEPARTMENT_OTHER): Payer: Self-pay

## 2024-04-26 ENCOUNTER — Encounter (HOSPITAL_BASED_OUTPATIENT_CLINIC_OR_DEPARTMENT_OTHER): Payer: Self-pay | Admitting: Nurse Practitioner

## 2024-04-26 ENCOUNTER — Other Ambulatory Visit: Payer: Self-pay

## 2024-04-26 VITALS — BP 121/66 | HR 72 | Resp 16 | Ht 69.0 in | Wt 208.9 lb

## 2024-04-26 DIAGNOSIS — X30XXXA Exposure to excessive natural heat, initial encounter: Secondary | ICD-10-CM | POA: Diagnosis not present

## 2024-04-26 DIAGNOSIS — R4189 Other symptoms and signs involving cognitive functions and awareness: Secondary | ICD-10-CM

## 2024-04-26 DIAGNOSIS — M7989 Other specified soft tissue disorders: Secondary | ICD-10-CM

## 2024-04-26 DIAGNOSIS — E860 Dehydration: Secondary | ICD-10-CM | POA: Diagnosis not present

## 2024-04-26 DIAGNOSIS — T675XXA Heat exhaustion, unspecified, initial encounter: Secondary | ICD-10-CM | POA: Diagnosis not present

## 2024-04-26 DIAGNOSIS — R55 Syncope and collapse: Secondary | ICD-10-CM

## 2024-04-26 DIAGNOSIS — I700 Atherosclerosis of aorta: Secondary | ICD-10-CM

## 2024-04-26 DIAGNOSIS — E785 Hyperlipidemia, unspecified: Secondary | ICD-10-CM

## 2024-04-26 DIAGNOSIS — I2510 Atherosclerotic heart disease of native coronary artery without angina pectoris: Secondary | ICD-10-CM

## 2024-04-26 DIAGNOSIS — Z79899 Other long term (current) drug therapy: Secondary | ICD-10-CM | POA: Insufficient documentation

## 2024-04-26 DIAGNOSIS — E538 Deficiency of other specified B group vitamins: Secondary | ICD-10-CM

## 2024-04-26 DIAGNOSIS — I10 Essential (primary) hypertension: Secondary | ICD-10-CM

## 2024-04-26 DIAGNOSIS — F0390 Unspecified dementia without behavioral disturbance: Secondary | ICD-10-CM | POA: Diagnosis not present

## 2024-04-26 LAB — BASIC METABOLIC PANEL WITH GFR
Anion gap: 7 (ref 5–15)
BUN: 11 mg/dL (ref 8–23)
CO2: 26 mmol/L (ref 22–32)
Calcium: 8.6 mg/dL — ABNORMAL LOW (ref 8.9–10.3)
Chloride: 104 mmol/L (ref 98–111)
Creatinine, Ser: 0.99 mg/dL (ref 0.44–1.00)
GFR, Estimated: 57 mL/min — ABNORMAL LOW (ref >60)
Glucose, Bld: 132 mg/dL — ABNORMAL HIGH (ref 70–99)
Potassium: 3.4 mmol/L — ABNORMAL LOW (ref 3.5–5.1)
Sodium: 137 mmol/L (ref 135–145)

## 2024-04-26 LAB — HEPATIC FUNCTION PANEL
ALT: 22 U/L (ref 0–44)
AST: 28 U/L (ref 15–41)
Albumin: 3.5 g/dL (ref 3.5–5.0)
Alkaline Phosphatase: 71 U/L (ref 38–126)
Bilirubin, Direct: 0.1 mg/dL (ref 0.0–0.2)
Indirect Bilirubin: 0.5 mg/dL (ref 0.3–0.9)
Total Bilirubin: 0.6 mg/dL (ref 0.0–1.2)
Total Protein: 6.6 g/dL (ref 6.5–8.1)

## 2024-04-26 LAB — CK: Total CK: 63 U/L (ref 38–234)

## 2024-04-26 LAB — CBC
HCT: 37.1 % (ref 36.0–46.0)
Hemoglobin: 12 g/dL (ref 12.0–15.0)
MCH: 30.7 pg (ref 26.0–34.0)
MCHC: 32.3 g/dL (ref 30.0–36.0)
MCV: 94.9 fL (ref 80.0–100.0)
Platelets: 257 10*3/uL (ref 150–400)
RBC: 3.91 MIL/uL (ref 3.87–5.11)
RDW: 13.3 % (ref 11.5–15.5)
WBC: 7.4 10*3/uL (ref 4.0–10.5)
nRBC: 0 % (ref 0.0–0.2)

## 2024-04-26 LAB — TROPONIN I (HIGH SENSITIVITY)
Troponin I (High Sensitivity): 3 ng/L (ref <18)
Troponin I (High Sensitivity): 3 ng/L (ref <18)

## 2024-04-26 MED ORDER — SODIUM CHLORIDE 0.9 % IV BOLUS
500.0000 mL | Freq: Once | INTRAVENOUS | Status: AC
  Administered 2024-04-26: 500 mL via INTRAVENOUS

## 2024-04-26 NOTE — ED Provider Triage Note (Signed)
 Emergency Medicine Provider Triage Evaluation Note  BRAXTON WEISBECKER , a 83 y.o. female  was evaluated in triage.  Pt complains of near syncope.  Review of Systems  Positive: Near syncope Negative: Chest pain  Physical Exam  BP (!) 124/111   Pulse 79   Temp 97.7 F (36.5 C) (Oral)   Resp 18   Wt 95 kg   SpO2 100%   BMI 30.93 kg/m  Awake and appropriate.   Medical Decision Making  Medically screening exam initiated at 2:20 PM.  Appropriate orders placed.  TYNESHA FREE was informed that the remainder of the evaluation will be completed by another provider, this initial triage assessment does not replace that evaluation, and the importance of remaining in the ED until their evaluation is complete.  Patient with near syncope.  Had been outside for about an hour although was initiated.  It was close to 100 degrees today.  Went inside and felt lightheaded.  Feeling somewhat better now.  No chest pain.  Differential diagnosis includes arrhythmia although potentially some dehydration also.  Will get blood work and small fluid bolus.  Initial blood pressure measured at 120/110.  Will recheck.   Patsey Lot, MD 04/26/24 1421

## 2024-04-26 NOTE — ED Provider Notes (Signed)
 [place] EMERGENCY DEPARTMENT AT [facility] Provider Note   CSN: [id number] Arrival date & time: 04/26/24  1403     Patient presents with: Loss of Consciousness   Jennifer Novak is a 83 y.o. female history of dementia, hypertension here presenting with loss of consciousness.  Patient went outside  for about an hour.  Patient went indoors and apparently passed out.  Patient states that she has been drinking fluids.  She denies any chest pain or shortness of breath.  Denies hitting her head when she passed out.   The history is provided by the patient.       Prior to Admission medications   Medication Sig Start Date End Date Taking? Authorizing Provider  Apoaequorin (PREVAGEN PO) Take by mouth daily.    [provider]  Cyanocobalamin  (B-12 COMPLIANCE INJECTION) 1000 MCG/ML KIT 1,000 mcg. 04/22/24   [provider]  diclofenac  sodium (VOLTAREN ) 1 % GEL Apply 1 application topically as needed for pain. 09/13/16   [provider]  donepezil  (ARICEPT  ODT) 5 MG disintegrating tablet Take 1 tablet (5 mg total) by mouth at bedtime. 03/19/24   Breeback, Jade L, PA-C  escitalopram  (LEXAPRO ) 5 MG tablet Take 1 tablet (5 mg total) by mouth daily. For mood. 03/19/24   Breeback, Jade L, PA-C  estradiol  (CLIMARA  - DOSED IN MG/24 HR) 0.1 mg/24hr patch APPLY ONE PATCH TOPICALLY TO THE SKIN ONE TIME A WEEK 03/26/24   Breeback, Jade L, PA-C  hydrochlorothiazide (HYDRODIURIL) 25 MG tablet Take 25 mg by mouth daily. 03/11/18   [provider]  losartan  (COZAAR ) 50 MG tablet Take 50 mg by mouth daily. 10/05/18   [provider]  pantoprazole  (PROTONIX ) 20 MG tablet Take 20 mg by mouth daily.    [provider]  rosuvastatin  (CRESTOR ) 10 MG tablet Take 1 tablet (10 mg total) by mouth daily. 02/23/24 05/23/24  Swinyer, [name], NP    Allergies: Penicillins, Clindamycin /lincomycin, Codeine, Egg-derived products, Hydrocodone , and Sulfa antibiotics     Review of Systems  Neurological:  Positive for dizziness.  All other systems reviewed and are negative.   Updated Vital Signs BP (!) 123/59 (BP Location: Right Arm)   Pulse 67   Temp 97.6 F (36.4 C) (Oral)   Resp 17   Wt 95 kg   SpO2 100%   BMI 30.93 kg/m   Physical Exam Vitals and nursing note reviewed.  Constitutional:      Comments: Slightly dehydrated  HENT:     Head: Normocephalic and atraumatic.     Nose: Nose normal.     Mouth/Throat:     Mouth: Mucous membranes are dry.   Eyes:     Extraocular Movements: Extraocular movements intact.     Pupils: Pupils are equal, round, and reactive to light.    Cardiovascular:     Rate and Rhythm: Normal rate and regular rhythm.     Pulses: Normal pulses.     Heart sounds: Normal heart sounds.  Pulmonary:     Effort: Pulmonary effort is normal.     Breath sounds: Normal breath sounds.  Abdominal:     General: Abdomen is flat.     Palpations: Abdomen is soft.   Musculoskeletal:        General: Normal range of motion.     Cervical back: Normal range of motion and neck supple.   Skin:    General: Skin is warm.     Capillary Refill: Capillary refill takes less  than 2 seconds.   Neurological:     General: No focal deficit present.     Mental Status: She is oriented to person, place, and time.     Comments: Cranial nerves II to XII intact.  Normal strength and sensation bilateral arms and legs  Psychiatric:        Mood and Affect: Mood normal.        Behavior: Behavior normal.     (all labs ordered are listed, but only abnormal results are displayed) Labs Reviewed  BASIC METABOLIC PANEL WITH GFR - Abnormal; Notable for the following components:      Result Value   Potassium 3.4 (*)    Glucose, Bld 132 (*)    Calcium  8.6 (*)    GFR, Estimated 57 (*)    All other components within normal limits  CBC  CK  HEPATIC FUNCTION PANEL  TROPONIN I (HIGH SENSITIVITY)  TROPONIN I (HIGH SENSITIVITY)    EKG: EKG  Interpretation Date/Time:  Monday April 26 2024 14:48:27 EDT Ventricular Rate:  71 PR Interval:  182 QRS Duration:  94 QT Interval:  394 QTC Calculation: 428 R Axis:   59  Text Interpretation: Normal sinus rhythm Low voltage QRS Cannot rule out Anterior infarct , age undetermined Abnormal ECG When compared with ECG of 05-Dec-2023 03:28, No significant change since last tracing Confirmed by Patt Alm DEL [phone]) on 04/26/2024 3:35:59 PM  Radiology: No results found.   Procedures   Medications Ordered in the ED  sodium chloride  0.9 % bolus 500 mL (0 mLs Intravenous Stopped 04/26/24 1554)                                    Medical Decision Making ADAYA Novak is a 83 y.o. female here presenting with dizziness and near syncope.  Concern for possible heat exhaustion.  Plan to get CBC CMP and CK level and troponin x 2.  Will hydrate patient and reassess  5:49 PM I reviewed patient's labs and troponin negative x 2.  CK levels normal.  Patient is feeling better after IV fluids.  Stable for discharge  Problems Addressed: Heat exhaustion, initial encounter: acute illness or injury Near syncope: acute illness or injury  Amount and/or Complexity of Data Reviewed Labs: ordered. Decision-making details documented in ED Course.    Final diagnoses:  None    ED Discharge Orders     None          Patt Alm Macho, MD 04/26/24 1750

## 2024-04-26 NOTE — ED Triage Notes (Signed)
 Patient BIB [facility] c/o syncopal episode after sitting outside for an hour.  Patient reports patient came inside, sat on the couch, and passed out.    110/64 CBG 125 18 R AC

## 2024-04-26 NOTE — Discharge Instructions (Signed)
 As we discussed, you have heat exhaustion  Please stay hydrated and stay out of the heat  See your doctor for follow-up  Return to ER if you have another episode of passing out or dizziness

## 2024-04-26 NOTE — Patient Instructions (Signed)
 Medication Instructions:  Your physician recommends that you continue on your current medications as directed. Please refer to the Current Medication list given to you today.  Follow-Up: At [facility], you and your health needs are our priority.  As part of our continuing mission to provide you with exceptional heart care, our providers are all part of one team.  This team includes your primary Cardiologist (physician) and Advanced Practice Providers or APPs (Physician Assistants and Nurse Practitioners) who all work together to provide you with the care you need, when you need it.  Your next appointment:   1 year with Rosaline Bane, NP

## 2024-04-26 NOTE — Progress Notes (Signed)
 Cardiology Office Note:  .   Date:  04/26/2024 ID:  Jennifer Novak, DOB [birth year], MRN [id number] PCP: [name] [facility] Providers Cardiologist:  None   Patient Profile: .      PMH Hyperlipidemia Hypertension Diverticulitis Coronary atherosclerosis noted on CT Aortic atherosclerosis    Seen by me on 02/23/24 as new patient consult for hyperlipidemia. She is the wife of a current patient and is here today with her daughter and her husband. Her daughter, Holley, reports that she sleeps an excessive amount of time, has experienced loss of appetite, difficulty walking, and memory issues. She continues to live independently with her husband, but her daughter lives close by and is very involved in their care. Patient confirms that she is not hungry and eats very little. She is not very active and sometimes has difficulty walking. They have encouraged her to walk with a cane. In terms of cognitive function, family reports she often starts to tell something and then goes blank. She also mentions an incident in January when she was unresponsive and had to be hospitalized. During this hospitalization, her temperature was significantly below normal and her potassium levels were low.  Head CT 12/05/2023 revealed no acute infarction or hemorrhage, chronic small vessel ischemia. she was treated with antibiotics and potassium supplements. She does not remember being in the hospital. No chest pain, shortness of breath, orthopnea, PND, presyncope, syncope. Having left leg edema for the past few weeks. She reports weight is stable.  Her family is planning a 2-week trip to [place]  at the end of May.   She was started on rosuvastatin  10 mg daily for LDL 134 which improved to 59 on labs completed 03/17/24. TTE completed 03/17/24 revealed normal LVEF 60-65%, normal RV, no significant valve disease.     History of Present Illness: .    History of Present Illness Jennifer Novak is an 83 year old  female who presents for follow-up of hyperlipidemia. She is accompanied by her daughter and husband. They report she continues to have low energy levels and takes frequent naps during the day. She is not very active at home despite encouragement from her family. They recently traveled to [place]  for fun and she reports that she enjoyed the trip. She was having diarrhea on oral B12 supplements and plans to resume B12 injections later this week. She has made dietary changes, including increased intake of eggs and red meat, to improve nutrition. She does not currently engage in exercise programs but has been encouraged to do chair exercises. She reports pain bilateral knee pain and mild swelling. She denies chest pain, shortness of breath, palpitations, orthopnea, PND, presyncope or syncope.   Family history: Her family history is not on file.   Discussed the use of AI scribe software for clinical note transcription with the patient, who gave verbal consent to proceed.  ROS: See HPI       Studies Reviewed: .         CT Abdomen/Pelvis 12/05/23 IMPRESSION: 1. Acute uncomplicated sigmoid diverticulitis. 2. Moderate to large hiatal hernia. 3. Cholelithiasis. 4. Coronary atherosclerosis. 5.  Aortic Atherosclerosis (ICD10-I70.0).  CT Head 12/05/23 IMPRESSION: 1. No acute finding. 2. 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 3. 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  Risk Assessment/Calculations:             Physical Exam:   VS: BP 121/66   Pulse 72   Resp 16   Ht 5'  9 (1.753 m)   Wt 208 lb 14.4 oz (94.8 kg)   SpO2 98%   BMI 30.85 kg/m   Wt Readings from Last 3 Encounters:  04/26/24 208 lb 14.4 oz (94.8 kg)  03/19/24 210 lb (95.3 kg)  02/23/24 206 lb 9.6 oz (93.7 kg)     GEN: Well nourished, well developed in no acute distress NECK: No JVD; No carotid bruits CARDIAC: RRR, no murmurs, rubs, gallops RESPIRATORY:  Clear to auscultation without rales, wheezing  or rhonchi  ABDOMEN: Soft, non-tender, non-distended EXTREMITIES:  2+ pitting edema LLE. No deformity     ASSESSMENT AND PLAN: .    Assessment & Plan Coronary artery calcification/Aortic Atherosclerosis Coronary artery calcification and aortic atherosclerosis noted on CT 12/05/2023. She denies chest pain, dyspnea, or other symptoms concerning for angina.  Echo completed 03/17/2024 with normal LVEF, no rwma, normal RV, no significant valve disease.  LDL is well controlled on rosuvastatin . Continue losartan , hydrochlorothiazide, rosuvastatin .   Leg Edema/Pain Bilateral knee pain and mild LE edema that is not pitting.  Echo completed 03/17/2024 with normal heart and valve function.  Encouraged increased physical activity and leg elevation when sedentary.  Hyperlipidemia LDL goal < 70 Lipid panel completed 03/17/2024 with total cholesterol 137, triglycerides 70, HDL 64, and LDL-C 59.  LDL improved from 134 on rosuvastatin  10 mg daily.  She is now at target of < 70 mg/dL or lower. No concerning side effects on rosuvastatin .   Hypertension BP is well controlled. Renal function stable on labs completed 12/19/23. No change in anti-hypertensive therapy today.   Cognitive decline   Family reports cognitive decline with memory issues and frequent sleeping. Admission 11/2023 with evaluations that ruled out cerebrovascular accident. She is alert and oriented to self, place and time on exam today. She is scheduled to undergo extensive cognitive testing soon.   Vitamin B12 deficiency   B12 deficiency managed with oral pills caused gastrointestinal issues. She will transition to B12 injections later this week to avoid side effects.  Family is hopeful this will improve energy levels. Management per PCP.     Disposition: 1 year with Dr. Raford or APP  Signed, Rosaline Bane, NP-C

## 2024-05-02 ENCOUNTER — Encounter: Payer: Self-pay | Admitting: Physician Assistant

## 2024-05-19 ENCOUNTER — Encounter: Payer: Self-pay | Admitting: Physician Assistant

## 2024-05-19 ENCOUNTER — Ambulatory Visit (INDEPENDENT_AMBULATORY_CARE_PROVIDER_SITE_OTHER): Admitting: Physician Assistant

## 2024-05-19 VITALS — BP 129/78 | HR 69 | Ht 69.0 in | Wt 209.0 lb

## 2024-05-19 DIAGNOSIS — Z79899 Other long term (current) drug therapy: Secondary | ICD-10-CM

## 2024-05-19 DIAGNOSIS — R2689 Other abnormalities of gait and mobility: Secondary | ICD-10-CM

## 2024-05-19 DIAGNOSIS — R413 Other amnesia: Secondary | ICD-10-CM

## 2024-05-19 DIAGNOSIS — R5383 Other fatigue: Secondary | ICD-10-CM | POA: Diagnosis not present

## 2024-05-19 DIAGNOSIS — E538 Deficiency of other specified B group vitamins: Secondary | ICD-10-CM

## 2024-05-19 DIAGNOSIS — I6789 Other cerebrovascular disease: Secondary | ICD-10-CM

## 2024-05-19 DIAGNOSIS — F03A3 Unspecified dementia, mild, with mood disturbance: Secondary | ICD-10-CM

## 2024-05-19 DIAGNOSIS — Z9181 History of falling: Secondary | ICD-10-CM

## 2024-05-19 DIAGNOSIS — R630 Anorexia: Secondary | ICD-10-CM

## 2024-05-19 DIAGNOSIS — R4589 Other symptoms and signs involving emotional state: Secondary | ICD-10-CM

## 2024-05-19 MED ORDER — ESCITALOPRAM OXALATE 5 MG PO TABS: ORAL_TABLET | ORAL | 5 refills | Status: DC

## 2024-05-19 NOTE — Patient Instructions (Addendum)
 Will order home PT for balance.  Increased lexapro  to 10mg  daily 2 of the 5mg  tablets.  Get labs after next b12 shot.  Start vitachew with vitamin D and calcium  for bone health.

## 2024-05-19 NOTE — Progress Notes (Unsigned)
   Established Patient Office Visit  Subjective   Patient ID: Jennifer Novak, female    DOB: [birth year]  Age: 83 y.o. MRN: [id number]  No chief complaint on file.   HPI  {History (Optional):23778}  ROS    Objective:     There were no vitals taken for this visit. {Vitals History (Optional):23777}  Physical Exam   No results found for any visits on 05/19/24.  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    Namira Rosekrans, PA-C

## 2024-05-20 ENCOUNTER — Other Ambulatory Visit: Payer: Self-pay | Admitting: Physician Assistant

## 2024-05-21 ENCOUNTER — Encounter: Payer: Self-pay | Admitting: Physician Assistant

## 2024-05-21 DIAGNOSIS — R5383 Other fatigue: Secondary | ICD-10-CM | POA: Insufficient documentation

## 2024-05-21 DIAGNOSIS — R630 Anorexia: Secondary | ICD-10-CM | POA: Insufficient documentation

## 2024-05-21 DIAGNOSIS — R2689 Other abnormalities of gait and mobility: Secondary | ICD-10-CM | POA: Insufficient documentation

## 2024-05-25 ENCOUNTER — Encounter: Payer: Self-pay | Admitting: Physician Assistant

## 2024-05-25 DIAGNOSIS — G8929 Other chronic pain: Secondary | ICD-10-CM

## 2024-05-26 DIAGNOSIS — G8929 Other chronic pain: Secondary | ICD-10-CM | POA: Insufficient documentation

## 2024-05-26 MED ORDER — TRAMADOL HCL 50 MG PO TABS: 50.0000 mg | ORAL_TABLET | Freq: Four times a day (QID) | ORAL | 0 refills | Status: AC | PRN

## 2024-05-26 NOTE — Telephone Encounter (Signed)
..  PDMP reviewed during this encounter. No concerns Tramadol  sent to pharmacy as needed for moderate to severe pain no more than every 6 hours.

## 2024-05-28 ENCOUNTER — Telehealth: Payer: Self-pay

## 2024-05-28 NOTE — Telephone Encounter (Signed)
 Returned call for PT. VO's given. Documentation for signature forthcoming.

## 2024-06-02 ENCOUNTER — Ambulatory Visit: Payer: Self-pay | Admitting: Family Medicine

## 2024-06-02 LAB — CMP14+EGFR
ALT: 29 IU/L (ref 0–32)
AST: 33 IU/L (ref 0–40)
Albumin: 4 g/dL (ref 3.7–4.7)
Alkaline Phosphatase: 101 IU/L (ref 44–121)
BUN/Creatinine Ratio: 14 (ref 12–28)
BUN: 11 mg/dL (ref 8–27)
Bilirubin Total: 0.4 mg/dL (ref 0.0–1.2)
CO2: 24 mmol/L (ref 20–29)
Calcium: 9.2 mg/dL (ref 8.7–10.3)
Chloride: 100 mmol/L (ref 96–106)
Creatinine, Ser: 0.8 mg/dL (ref 0.57–1.00)
Globulin, Total: 2.8 g/dL (ref 1.5–4.5)
Glucose: 92 mg/dL (ref 70–99)
Potassium: 4 mmol/L (ref 3.5–5.2)
Sodium: 139 mmol/L (ref 134–144)
Total Protein: 6.8 g/dL (ref 6.0–8.5)
eGFR: 74 mL/min/1.73 (ref >59)

## 2024-06-02 LAB — IRON,TIBC AND FERRITIN PANEL
Ferritin: 83 ng/mL (ref 15–150)
Iron Saturation: 19 % (ref 15–55)
Iron: 55 ug/dL (ref 27–139)
Total Iron Binding Capacity: 295 ug/dL (ref 250–450)
UIBC: 240 ug/dL (ref 118–369)

## 2024-06-02 LAB — B12 AND FOLATE PANEL
Folate: 5.8 ng/mL (ref >3.0)
Vitamin B-12: 1757 pg/mL — ABNORMAL HIGH (ref 232–1245)

## 2024-06-02 LAB — VITAMIN D 25 HYDROXY (VIT D DEFICIENCY, FRACTURES): Vit D, 25-Hydroxy: 42.9 ng/mL (ref 30.0–100.0)

## 2024-06-02 LAB — TSH+FREE T4
Free T4: 0.92 ng/dL (ref 0.82–1.77)
TSH: 4.76 u[IU]/mL — ABNORMAL HIGH (ref 0.450–4.500)

## 2024-06-02 NOTE — Progress Notes (Signed)
 Hi Jennifer Novak, Jennifer Novak is out of the office I am covering for her.  Your vitamin B12 levels are extremely high so if you are taking a supplement you really need to decrease how often.  Your folate is normal.  Your thyroid is just slightly elevated but T4 is normal.  To recheck TSH in about 2 to 3 months.  Your iron is also normal but a little on the lower end so I would encourage you to consider increasing your iron in your diet or taking a multivitamin with iron.  Metabolic panel looks great.  Vitamin D  is normal.

## 2024-06-18 ENCOUNTER — Other Ambulatory Visit: Payer: Self-pay | Admitting: Physician Assistant

## 2024-06-21 NOTE — Telephone Encounter (Signed)
 Can we find out how she is taking this so that I can send a months supply accordingly? Once or twice a day?

## 2024-06-25 ENCOUNTER — Encounter: Payer: Self-pay | Admitting: Physician Assistant

## 2024-06-25 MED ORDER — TRAMADOL HCL 50 MG PO TABS: 50.0000 mg | ORAL_TABLET | Freq: Two times a day (BID) | ORAL | 0 refills | Status: DC | PRN

## 2024-06-25 NOTE — Telephone Encounter (Signed)
 Tramadol  is helpful with pain.  SABRA.PDMP reviewed during this encounter. No concerns.  Sent 30 day for up to twice a day.

## 2024-07-06 ENCOUNTER — Encounter: Payer: Self-pay | Admitting: Sports Medicine

## 2024-07-12 ENCOUNTER — Other Ambulatory Visit: Payer: Self-pay | Admitting: Physician Assistant

## 2024-07-15 ENCOUNTER — Other Ambulatory Visit: Payer: Self-pay | Admitting: Physician Assistant

## 2024-07-27 ENCOUNTER — Ambulatory Visit (INDEPENDENT_AMBULATORY_CARE_PROVIDER_SITE_OTHER): Admitting: Physician Assistant

## 2024-07-27 VITALS — BP 131/71 | HR 72 | Ht 69.0 in | Wt 210.0 lb

## 2024-07-27 DIAGNOSIS — J22 Unspecified acute lower respiratory infection: Secondary | ICD-10-CM | POA: Diagnosis not present

## 2024-07-27 DIAGNOSIS — F03A3 Unspecified dementia, mild, with mood disturbance: Secondary | ICD-10-CM | POA: Diagnosis not present

## 2024-07-27 DIAGNOSIS — R5383 Other fatigue: Secondary | ICD-10-CM

## 2024-07-27 DIAGNOSIS — R051 Acute cough: Secondary | ICD-10-CM | POA: Diagnosis not present

## 2024-07-27 MED ORDER — ALBUTEROL SULFATE HFA 108 (90 BASE) MCG/ACT IN AERS: 2.0000 | INHALATION_SPRAY | Freq: Four times a day (QID) | RESPIRATORY_TRACT | 0 refills | Status: AC | PRN

## 2024-07-27 MED ORDER — PROMETHAZINE-DM 6.25-15 MG/5ML PO SYRP: 5.0000 mL | ORAL_SOLUTION | Freq: Four times a day (QID) | ORAL | 0 refills | Status: DC | PRN

## 2024-07-27 MED ORDER — LEVOFLOXACIN 500 MG PO TABS: 500.0000 mg | ORAL_TABLET | Freq: Every day | ORAL | 0 refills | Status: DC

## 2024-07-27 MED ORDER — BENZONATATE 200 MG PO CAPS: 200.0000 mg | ORAL_CAPSULE | Freq: Three times a day (TID) | ORAL | 0 refills | Status: DC | PRN

## 2024-07-27 MED ORDER — METHYLPREDNISOLONE 4 MG PO TBPK: ORAL_TABLET | ORAL | 0 refills | Status: DC

## 2024-07-27 NOTE — Progress Notes (Signed)
 "  Acute Office Visit  Subjective:     Patient ID: KIYARA BOUFFARD, female    DOB: May 19, 1941, 83 y.o.   MRN: [id number]  Chief Complaint  Patient presents with   Cough    HPI Discussed the use of AI scribe software for clinical note transcription with the patient, who gave verbal consent to proceed.  History of Present Illness LOLETTA HARPER is an 83 year old female who presents with persistent cough and fatigue following recent surgery on 07/23/24 for hammer toes. She is accompanied by her daughter who acts as historian due to patients dementia.   Cough and sputum production - Persistent cough since recent surgery - Slight improvement in cough since the weekend, but symptoms remain present - Takes a long time to expectorate phlegm upon waking - Sputum color not observed - No history of inhaler use at home  Fatigue and excessive sleepiness - Excessive sleepiness, tends to fall asleep whenever sitting down - Father concerned about degree of sleepiness - No sedating medications currently used - Father attributes some sleepiness to boredom and lack of stimulation  Recent surgery and current medications - Recent surgery-hammer toe repair. - Currently taking doxycycline every morning and night for toe-related preventative for infection.  - Receiving B12 injections - No sedating medications  Home remedies and support - Husband administering multiple daily shots of Fireball as a home remedy for symptoms - Initially unable to tolerate Fireball, but began using as symptoms worsened  Hearing impairment and social engagement - Difficulty hearing, making engagement with surroundings challenging - Often watches television with father - Able to complete a 100-piece puzzle with father prior to visit     ROS See HPI.      Objective:    BP 131/71 (BP Location: Left Arm, Patient Position: Sitting, Cuff Size: Normal)   Pulse 72   Ht 5' 9 (1.753 m)   Wt 210 lb (95.3 kg)   SpO2  98%   BMI 31.01 kg/m  BP Readings from Last 3 Encounters:  07/27/24 131/71  05/19/24 129/78  04/26/24 (!) 123/59   Wt Readings from Last 3 Encounters:  07/27/24 210 lb (95.3 kg)  05/19/24 209 lb (94.8 kg)  04/26/24 209 lb 7 oz (95 kg)      Physical Exam Constitutional:      Appearance: Normal appearance. She is obese.  HENT:     Head: Normocephalic.     Right Ear: Tympanic membrane, ear canal and external ear normal. There is no impacted cerumen.     Left Ear: Tympanic membrane, ear canal and external ear normal. There is no impacted cerumen.     Nose: Nose normal.     Mouth/Throat:     Mouth: Mucous membranes are moist.  Eyes:     Conjunctiva/sclera: Conjunctivae normal.  Cardiovascular:     Rate and Rhythm: Normal rate and regular rhythm.  Pulmonary:     Effort: Pulmonary effort is normal.     Comments: Productive cough. Crackles and wheezing at right lower lung base.  Musculoskeletal:     Right lower leg: No edema.     Left lower leg: No edema.  Neurological:     General: No focal deficit present.     Mental Status: She is alert and oriented to person, place, and time.  Psychiatric:        Mood and Affect: Mood normal.     Duoneb given in office. Seemed to make patient anxious.  Assessment & Plan:  SABRASABRAZuly was seen today for cough.  Diagnoses and all orders for this visit:  Lower respiratory infection -     benzonatate  (TESSALON ) 200 MG capsule; Take 1 capsule (200 mg total) by mouth 3 (three) times daily as needed. -     levofloxacin  (LEVAQUIN ) 500 MG tablet; Take 1 tablet (500 mg total) by mouth daily. -     promethazine -dextromethorphan (PROMETHAZINE -DM) 6.25-15 MG/5ML syrup; Take 5 mLs by mouth 4 (four) times daily as needed. -     methylPREDNISolone  (MEDROL  DOSEPAK) 4 MG TBPK tablet; Take as directed by package insert. -     albuterol  (VENTOLIN  HFA) 108 (90 Base) MCG/ACT inhaler; Inhale 2 puffs into the lungs every 6 (six) hours as needed. -      DG Chest 2 View; Future  No energy  Acute cough -     benzonatate  (TESSALON ) 200 MG capsule; Take 1 capsule (200 mg total) by mouth 3 (three) times daily as needed. -     levofloxacin  (LEVAQUIN ) 500 MG tablet; Take 1 tablet (500 mg total) by mouth daily. -     promethazine -dextromethorphan (PROMETHAZINE -DM) 6.25-15 MG/5ML syrup; Take 5 mLs by mouth 4 (four) times daily as needed. -     methylPREDNISolone  (MEDROL  DOSEPAK) 4 MG TBPK tablet; Take as directed by package insert. -     albuterol  (VENTOLIN  HFA) 108 (90 Base) MCG/ACT inhaler; Inhale 2 puffs into the lungs every 6 (six) hours as needed. -     DG Chest 2 View; Future  Mild dementia with mood disturbance, unspecified dementia type (HCC)    Assessment & Plan Acute lower respiratory infection with productive cough Inadequate response to doxycycline. Persistent productive cough, possible wheezing, and chest tightness. Consideration of antibiotic switch and anti-inflammatory treatment. Potential chest x-ray if no improvement by Thursday or Friday. - Stop doxycycline. - Start Levaquin  for seven days. - Administer Medrol  Dosepak. - Provide albuterol  inhaler for use every four to six hours as needed for cough and shortness of breath. Breathing treatment made patient anxious. Only use if needed - Administer breathing treatment in office and reassess lung sounds. - Order chest x-ray if not improving by Thursday or Friday. - Provide promethazine , dextromethorphan cough syrup as needed. - Continue Tessalon  Perles as needed for cough.  Dementia with mood disturbance Mood disturbance with increased sleepiness and lack of engagement. Sleepiness possibly due to recent anesthesia and lack of stimulating activities. No sedating medications prescribed. Discussed importance of stimulating activities for mood and alertness. - Encourage engagement in stimulating activities to improve mood and alertness. - Discuss with family the importance of finding  mutually enjoyable activities to increase engagement.    Lasean Gorniak, PA-C   "

## 2024-07-27 NOTE — Patient Instructions (Addendum)
 Stop doxycycline.  Start levaquin .  Tessalon  pearls as needed for cough.  Promethazine  cough syrup as needed. Start medrol  dose pack.  If not improving chest xray at the end of the week

## 2024-07-28 ENCOUNTER — Encounter: Payer: Self-pay | Admitting: Physician Assistant

## 2024-08-02 ENCOUNTER — Other Ambulatory Visit: Payer: Self-pay

## 2024-08-02 ENCOUNTER — Emergency Department (HOSPITAL_COMMUNITY)

## 2024-08-02 ENCOUNTER — Emergency Department (HOSPITAL_COMMUNITY)
Admission: EM | Admit: 2024-08-02 | Discharge: 2024-08-02 | Disposition: A | Discharge status: Home / Self Care | Attending: Emergency Medicine | Admitting: Emergency Medicine

## 2024-08-02 ENCOUNTER — Encounter (HOSPITAL_COMMUNITY): Payer: Self-pay

## 2024-08-02 ENCOUNTER — Encounter: Payer: Self-pay | Admitting: Physician Assistant

## 2024-08-02 DIAGNOSIS — F0390 Unspecified dementia without behavioral disturbance: Secondary | ICD-10-CM | POA: Diagnosis not present

## 2024-08-02 DIAGNOSIS — K449 Diaphragmatic hernia without obstruction or gangrene: Secondary | ICD-10-CM

## 2024-08-02 DIAGNOSIS — D4989 Neoplasm of unspecified behavior of other specified sites: Secondary | ICD-10-CM

## 2024-08-02 DIAGNOSIS — R55 Syncope and collapse: Secondary | ICD-10-CM | POA: Insufficient documentation

## 2024-08-02 DIAGNOSIS — C383 Malignant neoplasm of mediastinum, part unspecified: Secondary | ICD-10-CM | POA: Insufficient documentation

## 2024-08-02 LAB — CBC WITH DIFFERENTIAL/PLATELET
Abs Immature Granulocytes: 0.05 K/uL (ref 0.00–0.07)
Basophils Absolute: 0.1 K/uL (ref 0.0–0.1)
Basophils Relative: 1 %
Eosinophils Absolute: 0.2 K/uL (ref 0.0–0.5)
Eosinophils Relative: 2 %
HCT: 40.6 % (ref 36.0–46.0)
Hemoglobin: 13.3 g/dL (ref 12.0–15.0)
Immature Granulocytes: 1 %
Lymphocytes Relative: 8 %
Lymphs Abs: 0.8 K/uL (ref 0.7–4.0)
MCH: 30.5 pg (ref 26.0–34.0)
MCHC: 32.8 g/dL (ref 30.0–36.0)
MCV: 93.1 fL (ref 80.0–100.0)
Monocytes Absolute: 0.7 K/uL (ref 0.1–1.0)
Monocytes Relative: 7 %
Neutro Abs: 8.4 K/uL — ABNORMAL HIGH (ref 1.7–7.7)
Neutrophils Relative %: 81 %
Platelets: 260 K/uL (ref 150–400)
RBC: 4.36 MIL/uL (ref 3.87–5.11)
RDW: 13.6 % (ref 11.5–15.5)
WBC: 10.3 K/uL (ref 4.0–10.5)
nRBC: 0 % (ref 0.0–0.2)

## 2024-08-02 LAB — COMPREHENSIVE METABOLIC PANEL WITH GFR
ALT: 46 U/L — ABNORMAL HIGH (ref 0–44)
AST: 49 U/L — ABNORMAL HIGH (ref 15–41)
Albumin: 3.4 g/dL — ABNORMAL LOW (ref 3.5–5.0)
Alkaline Phosphatase: 74 U/L (ref 38–126)
Anion gap: 9 (ref 5–15)
BUN: 14 mg/dL (ref 8–23)
CO2: 27 mmol/L (ref 22–32)
Calcium: 8.9 mg/dL (ref 8.9–10.3)
Chloride: 101 mmol/L (ref 98–111)
Creatinine, Ser: 1.21 mg/dL — ABNORMAL HIGH (ref 0.44–1.00)
GFR, Estimated: 44 mL/min — ABNORMAL LOW (ref >60)
Glucose, Bld: 117 mg/dL — ABNORMAL HIGH (ref 70–99)
Potassium: 3.2 mmol/L — ABNORMAL LOW (ref 3.5–5.1)
Sodium: 137 mmol/L (ref 135–145)
Total Bilirubin: 0.5 mg/dL (ref 0.0–1.2)
Total Protein: 6.4 g/dL — ABNORMAL LOW (ref 6.5–8.1)

## 2024-08-02 LAB — TROPONIN I (HIGH SENSITIVITY)
Troponin I (High Sensitivity): 3 ng/L (ref <18)
Troponin I (High Sensitivity): 3 ng/L (ref <18)

## 2024-08-02 LAB — CK: Total CK: 27 U/L — ABNORMAL LOW (ref 38–234)

## 2024-08-02 MED ORDER — SODIUM CHLORIDE 0.9 % IV BOLUS
1000.0000 mL | Freq: Once | INTRAVENOUS | Status: AC
  Administered 2024-08-02: 1000 mL via INTRAVENOUS

## 2024-08-02 MED ORDER — IOHEXOL 350 MG/ML SOLN
75.0000 mL | Freq: Once | INTRAVENOUS | Status: AC | PRN
  Administered 2024-08-02: 75 mL via INTRAVENOUS

## 2024-08-02 NOTE — ED Triage Notes (Signed)
 Patient BIB EMS from home after a witnessed syncopal event. Upon fire arrival patient was hypotensive with systolic in the 70s. Upon EMS arrival patient had a systolic of 90s. After 500cc patient's BP did increase some. 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

## 2024-08-02 NOTE — ED Provider Notes (Signed)
 [place] EMERGENCY DEPARTMENT AT [facility] Provider Note   CSN: [id number] Arrival date & time: 08/02/24  1827     Patient presents with: Loss of Consciousness   Jennifer Novak is a 83 y.o. female history of hammertoes s/p resection 10 days ago, dementia, here presenting with syncope.  Patient ate some food around 4 PM and patient had a witnessed syncopal event by family.  Patient unable to tell me what happened.  Patient was noted to be hypotensive with blood pressure in the 70s.  EMS gave her 500 cc IV bolus.  No seizure activity witnessed   The history is provided by the patient.       Prior to Admission medications   Medication Sig Start Date End Date Taking? Authorizing Provider  albuterol  (VENTOLIN  HFA) 108 (90 Base) MCG/ACT inhaler Inhale 2 puffs into the lungs every 6 (six) hours as needed. 07/27/24   Breeback, Jade L, PA-C  Apoaequorin (PREVAGEN PO) Take by mouth daily.    [provider]  benzonatate  (TESSALON ) 200 MG capsule Take 1 capsule (200 mg total) by mouth 3 (three) times daily as needed. 07/27/24   Antoniette Vermell CROME, PA-C  Calcium  Citrate-Vitamin D  200-6.25 MG-MCG TABS TAKE ONE TABLET BY MOUTH ONCE a DAY 05/21/24   Breeback, Jade L, PA-C  Cyanocobalamin  (B-12 COMPLIANCE INJECTION) 1000 MCG/ML KIT 1,000 mcg. 04/22/24   [provider]  diclofenac  sodium (VOLTAREN ) 1 % GEL Apply 1 application topically as needed for pain. 09/13/16   [provider]  escitalopram  (LEXAPRO ) 5 MG tablet Take 2 tablets daily for mood. 05/19/24   Breeback, Jade L, PA-C  estradiol  (CLIMARA  - DOSED IN MG/24 HR) 0.1 mg/24hr patch APPLY ONE PATCH TOPICALLY TO THE SKIN ONE TIME A WEEK 03/26/24   Breeback, Jade L, PA-C  hydrochlorothiazide (HYDRODIURIL) 25 MG tablet Take 25 mg by mouth daily. 03/11/18   [provider]  levofloxacin  (LEVAQUIN ) 500 MG tablet Take 1 tablet (500 mg total) by mouth daily. 07/27/24   Breeback, Jade L, PA-C  losartan  (COZAAR ) 50  MG tablet Take 50 mg by mouth daily. 10/05/18   [provider]  methylPREDNISolone  (MEDROL  DOSEPAK) 4 MG TBPK tablet Take as directed by package insert. 07/27/24   Breeback, Jade L, PA-C  pantoprazole  (PROTONIX ) 40 MG tablet TAKE ONE TABLET (40 MG) BY MOUTH DAILY 07/13/24   Breeback, Jade L, PA-C  promethazine -dextromethorphan (PROMETHAZINE -DM) 6.25-15 MG/5ML syrup Take 5 mLs by mouth 4 (four) times daily as needed. 07/27/24   Breeback, Jade L, PA-C  rosuvastatin  (CRESTOR ) 10 MG tablet Take 1 tablet (10 mg total) by mouth daily. 02/23/24 05/23/24  Swinyer, [name], NP  traMADol  (ULTRAM ) 50 MG tablet Take 1 tablet (50 mg total) by mouth every 12 (twelve) hours as needed. 07/16/24 08/15/24  Breeback, Jade L, PA-C  traMADol  (ULTRAM ) 50 MG tablet Take 1 tablet (50 mg total) by mouth every 12 (twelve) hours as needed. 08/15/24 09/14/24  Breeback, Jade L, PA-C    Allergies: Penicillins, Clindamycin /lincomycin, Codeine, Egg-derived products, Hydrocodone , and Sulfa antibiotics    Review of Systems  Neurological:  Positive for dizziness.  All other systems reviewed and are negative.   Updated Vital Signs BP 126/64   Pulse 66   Temp 97.6 F (36.4 C) (Oral)   Resp (!) 21   SpO2 100%   Physical Exam Vitals and nursing note reviewed.  Constitutional:      Appearance: Normal appearance.     Comments: Demented  HENT:  Head: Normocephalic.     Nose: Nose normal.     Mouth/Throat:     Mouth: Mucous membranes are dry.  Eyes:     Extraocular Movements: Extraocular movements intact.     Pupils: Pupils are equal, round, and reactive to light.  Cardiovascular:     Rate and Rhythm: Normal rate and regular rhythm.     Pulses: Normal pulses.     Heart sounds: Normal heart sounds.  Pulmonary:     Effort: Pulmonary effort is normal.     Breath sounds: Normal breath sounds.  Abdominal:     General: Abdomen is flat.     Palpations: Abdomen is soft.  Musculoskeletal:     Cervical back:  Normal range of motion and neck supple.     Comments: Bilateral hammertoe resection.  No signs of osteomyelitis  Skin:    General: Skin is warm.     Capillary Refill: Capillary refill takes less than 2 seconds.  Neurological:     Mental Status: She is alert.     Comments: Demented and moving all extremities  Psychiatric:        Mood and Affect: Mood normal.        Behavior: Behavior normal.     (all labs ordered are listed, but only abnormal results are displayed) Labs Reviewed  CBC WITH DIFFERENTIAL/PLATELET - Abnormal; Notable for the following components:      Result Value   Neutro Abs 8.4 (*)    All other components within normal limits  COMPREHENSIVE METABOLIC PANEL WITH GFR - Abnormal; Notable for the following components:   Potassium 3.2 (*)    Glucose, Bld 117 (*)    Creatinine, Ser 1.21 (*)    Total Protein 6.4 (*)    Albumin 3.4 (*)    AST 49 (*)    ALT 46 (*)    GFR, Estimated 44 (*)    All other components within normal limits  CK - Abnormal; Notable for the following components:   Total CK 27 (*)    All other components within normal limits  TROPONIN I (HIGH SENSITIVITY)  TROPONIN I (HIGH SENSITIVITY)    EKG: EKG Interpretation Date/Time:  Monday August 02 2024 18:54:31 EDT Ventricular Rate:  68 PR Interval:  192 QRS Duration:  99 QT Interval:  438 QTC Calculation: 466 R Axis:   70  Text Interpretation: Sinus rhythm No significant change since last tracing Confirmed by Patt Alm DEL [id number]) on 08/02/2024 7:08:23 PM  Radiology: CT Angio Chest PE W and/or Wo Contrast Result Date: 08/02/2024 EXAM: CTA CHEST 08/02/2024 09:38:07 PM TECHNIQUE: CTA of the chest was performed after the administration of 75 mL of iohexol  (OMNIPAQUE ) 350 MG/ML intravenous contrast. Multiplanar reformatted images are provided for review. MIP images are provided for review. Automated exposure control, iterative reconstruction, and/or weight based adjustment of the mA/kV was  utilized to reduce the radiation dose to as low as reasonably achievable. COMPARISON: None available. CLINICAL HISTORY: Pulmonary embolism (PE) suspected, high prob. Patient BIB EMS from home after a witnessed syncopal event. FINDINGS: PULMONARY ARTERIES: Pulmonary arteries are adequately opacified for evaluation. Breathing motion artifacts in the lower lobes. Motion creates pseudo filling defects within segmental right lower lobe pulmonary arteries, which do not conform to the vessel distally. This represents atifact. No acute pulmonary embolus. Main pulmonary artery is normal in caliber. MEDIASTINUM: The heart is mildly enlarged. There are coronary artery calcifications. No acute aortic findings. Aortic atherosclerosis. 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 This abuts the right lateral aspect of T5 vertebral body with questionable tail extending to the spinal canal, sagittal series 11 image 108. Hounsfield units are 6. LYMPH NODES: No mediastinal, hilar or axillary lymphadenopathy. LUNGS AND PLEURA: Scattered segmental linear atelectasis. No focal consolidation or pulmonary edema. No evidence of pleural effusion or pneumothorax. UPPER ABDOMEN: Large hiatal hernia, greater than 50% of the stomach is intrathoracic. Calcified granuloma in the left lobe of the liver. SOFT TISSUES AND BONES: Thoracic spondylosis. The bones are subjectively undermineralized. Remote right posterior lower rib fracture. No acute soft tissue abnormality. IMPRESSION: 1. No evidence of pulmonary embolism. 2. 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 Favor a peripheral nerve sheath tumor. Recommend thoracic spine MRI for further assessment. 3. Large hiatal hernia with greater than 50% of the stomach intrathoracic. Electronically signed by: Andrea Gasman MD 08/02/2024  09:53 PM EDT RP Workstation: [id number]   CT HEAD WO CONTRAST ( ) Result Date: 08/02/2024 CLINICAL DATA:  Syncope. EXAM: CT HEAD WITHOUT CONTRAST TECHNIQUE: Contiguous axial images were obtained from the base of the skull through the vertex without intravenous contrast. RADIATION DOSE REDUCTION: This exam was performed according to the departmental dose-optimization program which includes automated exposure control, adjustment of the mA and/or kV according to patient size and/or use of iterative reconstruction technique. COMPARISON:  December 05, 2023 FINDINGS: Brain: There is generalized cerebral atrophy with widening of the extra-axial spaces and ventricular dilatation. There are areas of decreased attenuation within the white matter tracts of the supratentorial brain, consistent with microvascular disease changes. Vascular: No hyperdense vessel or unexpected calcification. Skull: Normal. Negative for fracture or focal lesion. 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 Sinuses/Orbits: No acute finding. Other: None. IMPRESSION: 1. Generalized cerebral atrophy and microvascular disease changes of the supratentorial brain. 2. No acute intracranial abnormality. 3. Left-sided cochlear implant. Electronically Signed   By: Suzen Dials M.D.   On: 08/02/2024 19:17     Procedures   Medications Ordered in the ED  sodium chloride  0.9 % bolus 1,000 mL (0 mLs Intravenous Stopped 08/02/24 2114)  iohexol  (OMNIPAQUE ) 350 MG/ML injection 75 mL (75 mLs Intravenous Contrast Given 08/02/24 2132)                                    Medical Decision Making ARTURO FREUNDLICH is a 83 y.o. female here presenting with syncope.  Patient's blood pressure apparently was in the 70s.  Patient's blood pressure on arrival was 137/38.  I wonder if she had a vasovagal syncope versus dehydration versus ACS.  Plan  to get CBC and CMP and troponin x 2 and chest x-ray.  Will hydrate patient and reassess  10:15 PM I reviewed patient's labs and patient has a posterior mediastinal mass.  Patient does have a right lateral aspect extension into the spinal canal.  Patient is able to ambulate and does not have any incontinence.  Unfortunately patient cannot get MRI because of cochlear implant.  I wonder if this is causing her occasional syncopal episodes.  I will refer her to neurosurgery for further evaluation and monitoring.  Problems Addressed: Mediastinal tumor: acute illness or injury Syncope, unspecified syncope  type: acute illness or injury  Amount and/or Complexity of Data Reviewed Labs: ordered. Decision-making details documented in ED Course. Radiology: ordered and independent interpretation performed. Decision-making details documented in ED Course. ECG/medicine tests: ordered and independent interpretation performed. Decision-making details documented in ED Course.  Risk Prescription drug management.     Final diagnoses:  None    ED Discharge Orders     None          Patt Alm Macho, MD 08/02/24 2217

## 2024-08-02 NOTE — ED Notes (Signed)
 Patient transported to CT

## 2024-08-02 NOTE — Discharge Instructions (Signed)
 As we discussed, your lab work were normal today.  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  I have referred you to a neurosurgeon for follow-up.  I have also referred you to our oncologist  Return to ER if she has another episode of passing out or severe pain or vomiting or trouble walking or incontinence

## 2024-08-03 ENCOUNTER — Ambulatory Visit: Payer: Self-pay

## 2024-08-03 NOTE — Telephone Encounter (Signed)
 Patient scheduled for 10/01/225 with Jade Breeback

## 2024-08-03 NOTE — Telephone Encounter (Signed)
 FYI Only or Action Required?: FYI only for provider.  Patient was last seen in primary care on 07/27/2024 by Antoniette Vermell CROME, PA-C.  Called Nurse Triage reporting Hematuria.  Symptoms began today.  Interventions attempted: Nothing.  Symptoms are: unchanged.  Triage Disposition: See Physician Within 24 Hours  Patient/caregiver understands and will follow disposition?: Yes     Copied from CRM #[id number]. Topic: Clinical - Red Word Triage >> Aug 03, 2024  3:45 PM Nathanel BROCKS wrote: Red Word that prompted transfer to Nurse Triage: pt went to the ER last night for Vegasagil/blood pressure dropped and she past out. She went to the bathroom today and there is blood in her urine. A noticeable amount. Reason for Disposition  [1] Pink or red-colored urine and likely from food (beets, rhubarb, red food dye) AND [2] lasts > 24 hours after stopping food  Answer Assessment - Initial Assessment Questions 1. COLOR of URINE: Describe the color of the urine.  (e.g., tea-colored, pink, red, bloody) Do you have blood clots in your urine? (e.g., none, pea, grape, small coin)     Yellow with spot of blood in it 2. ONSET: When did the bleeding start?      This am 3. EPISODES: How many times has there been blood in the urine? or How many times today?     Just once 4. PAIN with URINATION: Is there any pain with passing your urine? If Yes, ask: How bad is the pain?  (Scale 1-10; or mild, moderate, severe)     no 5. FEVER: Do you have a fever? If Yes, ask: What is your temperature, how was it measured, and when did it start?     no 6. ASSOCIATED SYMPTOMS: Are you passing urine more frequently than usual?     no 7. OTHER SYMPTOMS: Do you have any other symptoms? (e.g., back/flank pain, abdomen pain, vomiting)     no 8. PREGNANCY: Is there any chance you are pregnant? When was your last menstrual period?     na  Protocols used: Urine - Blood In-A-AH

## 2024-08-04 ENCOUNTER — Encounter: Payer: Self-pay | Admitting: Physician Assistant

## 2024-08-04 ENCOUNTER — Ambulatory Visit (INDEPENDENT_AMBULATORY_CARE_PROVIDER_SITE_OTHER): Admitting: Physician Assistant

## 2024-08-04 VITALS — BP 151/72 | HR 70 | Ht 67.0 in | Wt 210.0 lb

## 2024-08-04 DIAGNOSIS — N182 Chronic kidney disease, stage 2 (mild): Secondary | ICD-10-CM

## 2024-08-04 DIAGNOSIS — R55 Syncope and collapse: Secondary | ICD-10-CM | POA: Diagnosis not present

## 2024-08-04 DIAGNOSIS — R300 Dysuria: Secondary | ICD-10-CM

## 2024-08-04 DIAGNOSIS — E876 Hypokalemia: Secondary | ICD-10-CM

## 2024-08-04 DIAGNOSIS — R310 Gross hematuria: Secondary | ICD-10-CM

## 2024-08-04 DIAGNOSIS — R770 Abnormality of albumin: Secondary | ICD-10-CM | POA: Diagnosis not present

## 2024-08-04 DIAGNOSIS — R319 Hematuria, unspecified: Secondary | ICD-10-CM

## 2024-08-04 DIAGNOSIS — R748 Abnormal levels of other serum enzymes: Secondary | ICD-10-CM | POA: Diagnosis not present

## 2024-08-04 LAB — POCT URINALYSIS DIP (CLINITEK)
Bilirubin, UA: NEGATIVE
Blood, UA: NEGATIVE
Glucose, UA: NEGATIVE mg/dL
Ketones, POC UA: NEGATIVE mg/dL
Leukocytes, UA: NEGATIVE
Nitrite, UA: NEGATIVE
POC PROTEIN,UA: NEGATIVE
Spec Grav, UA: 1.025 (ref 1.010–1.025)
Urobilinogen, UA: 0.2 U/dL
pH, UA: 5.5 (ref 5.0–8.0)

## 2024-08-04 NOTE — Progress Notes (Signed)
 Established Patient Office Visit  Subjective   Patient ID: Jennifer Novak, female    DOB: November 29, 1940  Age: 83 y.o. MRN: [id number]  Chief Complaint  Patient presents with   Medical Management of Chronic Issues    HPI Discussed the use of AI scribe software for clinical note transcription with the patient, who gave verbal consent to proceed.  History of Present Illness Jennifer Novak is an 83 year old female who presents for follow-up after an ED visit for syncope. She is accompanied by her daughter.  Syncope - Syncopal episode occurred on August 02, 2024, resulting in emergency department evaluation - Found unresponsive in a chair with 'pinpoint' pupils as described by firefighters - Regained consciousness after approximately 40 minutes - Similar syncopal episodes have occurred since childhood, with the most recent prior episode several months ago - No further syncopal episodes since hospital discharge - Blood pressure was very low during the episode, with difficulty obtaining a reading by emergency personnel - Mother also experienced similar syncopal episodes  Electrolyte and laboratory abnormalities - Laboratory evaluation in the emergency department revealed hypokalemia, hypoalbuminemia, and mildly elevated liver enzymes - Decreased kidney function noted on laboratory assessment  Hydration status - Previously consumed one small bottle of water per day - Currently increased fluid intake to one large bottle of water daily  Visual hallucinations - Experiencing visual hallucinations at night, specifically seeing people in her room - No associated shortness of breath - Daughter associates hallucinations with possible urinary tract infection  Pain management - Tramadol  discontinued two weeks ago, previously used for pain control  Mediastinal mass evaluation - History of evaluation by neurosurgery for mediastinal mass - No current neurosurgery appointments scheduled;  daughter has contacted neurosurgery to arrange follow-up - No prior EEG or heart monitor performed - CT scan previously performed by cardiology    ROS See HPI.    Objective:     BP (!) 151/72   Pulse 70   Ht 5' 7 (1.702 m)   Wt 210 lb (95.3 kg)   SpO2 99%   BMI 32.89 kg/m  BP Readings from Last 3 Encounters:  08/06/24 136/73  08/04/24 (!) 151/72  08/02/24 129/67   Wt Readings from Last 3 Encounters:  08/06/24 209 lb (94.8 kg)  08/04/24 210 lb (95.3 kg)  07/27/24 210 lb (95.3 kg)    .SABRA Results for orders placed or performed in visit on 08/04/24  Urine Culture   Collection Time: 08/04/24 12:00 AM   Specimen: Urine   Urine  Result Value Ref Range   Urine Culture, Routine Final report    Organism ID, Bacteria Comment   POCT URINALYSIS DIP (CLINITEK)   Collection Time: 08/04/24 11:27 AM  Result Value Ref Range   Color, UA yellow yellow   Clarity, UA clear clear   Glucose, UA negative negative mg/dL   Bilirubin, UA negative negative   Ketones, POC UA negative negative mg/dL   Spec Grav, UA 8.974 8.989 - 1.025   Blood, UA negative negative   pH, UA 5.5 5.0 - 8.0   POC PROTEIN,UA negative negative, trace   Urobilinogen, UA 0.2 0.2 or 1.0 E.U./dL   Nitrite, UA Negative Negative   Leukocytes, UA Negative Negative  CMP14+EGFR   Collection Time: 08/04/24 12:24 PM  Result Value Ref Range   Glucose 80 70 - 99 mg/dL   BUN 9 8 - 27 mg/dL   Creatinine, Ser 9.28 0.57 - 1.00 mg/dL   eGFR 84 >40  mL/min/1.73   BUN/Creatinine Ratio 13 12 - 28   Sodium 138 134 - 144 mmol/L   Potassium 3.8 3.5 - 5.2 mmol/L   Chloride 101 96 - 106 mmol/L   CO2 24 20 - 29 mmol/L   Calcium  9.1 8.7 - 10.3 mg/dL   Total Protein 6.3 6.0 - 8.5 g/dL   Albumin 3.8 3.7 - 4.7 g/dL   Globulin, Total 2.5 1.5 - 4.5 g/dL   Bilirubin Total 0.4 0.0 - 1.2 mg/dL   Alkaline Phosphatase 89 48 - 129 IU/L   AST 27 0 - 40 IU/L   ALT 32 0 - 32 IU/L     Physical Exam Constitutional:      Appearance:  Normal appearance.  HENT:     Head: Normocephalic.  Cardiovascular:     Rate and Rhythm: Normal rate and regular rhythm.  Pulmonary:     Effort: Pulmonary effort is normal.     Breath sounds: Normal breath sounds.  Neurological:     General: No focal deficit present.     Mental Status: She is alert and oriented to person, place, and time.  Psychiatric:        Mood and Affect: Mood normal.        Assessment & Plan:  Lucca was seen today for medical management of chronic issues.  Diagnoses and all orders for this visit:  Syncope, unspecified syncope type -     LONG TERM MONITOR (3-14 DAYS); Future  Dysuria  Gross hematuria -     POCT URINALYSIS DIP (CLINITEK) -     Urine Culture  Hypokalemia -     CMP14+EGFR  Elevated liver enzymes -     CMP14+EGFR  Low serum albumin -     CMP14+EGFR  CKD (chronic kidney disease) stage 2, GFR 60-89 ml/min   Assessment & Plan Syncope Recent syncopal episode, likely vasovagal, possibly due to dehydration or blood pressure fluctuations. - Order heart monitor to evaluate for arrhythmias. - Send heart monitor results to cardiology. - Schedule follow-up with neurology for further evaluation, including consideration of EEG.  Hematuria and dysuria, possible urinary tract infection Hematuria and dysuria suggestive of UTI. Hallucinations possibly related to UTI. - Obtain urine sample for urinalysis to evaluate for UTI. - UA in office negative for any signs of infection  Malnutrition and dehydration Low albumin levels indicate possible malnutrition. Dehydration suspected due to low fluid intake. - Encourage increased fluid intake, aiming for more than one large bottle of water per day.  Hypertension Blood pressure fluctuations with recent high readings. Consider adjusting antihypertensive medication, but concerns about potential for further drops in blood pressure.  CKD 2 Decreased kidney function noted in recent labs.  Abnormal  liver enzymes Slightly elevated liver enzymes noted in recent labs.  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 Recent hallucinations.     Desiraye Rolfson, PA-C

## 2024-08-04 NOTE — Patient Instructions (Signed)
 Will order zio patch for heart.  Get in with neurology.  Will place another referral for [facility] neurosurgery.  Stay on same medications for now.

## 2024-08-05 ENCOUNTER — Encounter: Payer: Self-pay | Admitting: Medical Oncology

## 2024-08-05 LAB — CMP14+EGFR
ALT: 32 IU/L (ref 0–32)
AST: 27 IU/L (ref 0–40)
Albumin: 3.8 g/dL (ref 3.7–4.7)
Alkaline Phosphatase: 89 IU/L (ref 48–129)
BUN/Creatinine Ratio: 13 (ref 12–28)
BUN: 9 mg/dL (ref 8–27)
Bilirubin Total: 0.4 mg/dL (ref 0.0–1.2)
CO2: 24 mmol/L (ref 20–29)
Calcium: 9.1 mg/dL (ref 8.7–10.3)
Chloride: 101 mmol/L (ref 96–106)
Creatinine, Ser: 0.71 mg/dL (ref 0.57–1.00)
Globulin, Total: 2.5 g/dL (ref 1.5–4.5)
Glucose: 80 mg/dL (ref 70–99)
Potassium: 3.8 mmol/L (ref 3.5–5.2)
Sodium: 138 mmol/L (ref 134–144)
Total Protein: 6.3 g/dL (ref 6.0–8.5)
eGFR: 84 mL/min/1.73 (ref >59)

## 2024-08-05 NOTE — Progress Notes (Signed)
 [facility] [phone]   [phone]  INITIAL CONSULTATION:  Patient Care Team: [name] as PCP - General (Family Medicine) [name], MD as PCP - Cardiology (Cardiology) Swinyer, [name], NP as Nurse Practitioner (Cardiology) Golden Forestine BROCKS, RN as Oncology Nurse Navigator (Medical Oncology)  CHIEF COMPLAINTS/PURPOSE OF CONSULTATION:  Mediastinal mass   HISTORY OF PRESENTING ILLNESS:  Jennifer Novak 83 y.o. female with medical history significant for hypertension, peripheral artery disease, cerebral microvascular disease, large hiatal hernia, GERD, mild dementia with mood disturbance, B12 deficiency, anemia.  On review of the previous records patient is being referred by EDP Dr. Patt.  Patient was seen in the emergency department on 08/02/2024 for workup of loss of consciousness.  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  On exam today patient is accompanied by spouse and daughter [name] provides majority of history as patient has history of dementia. Daughter reports patient has not had any recent complaints.  She did have a surgery  on 07/23/24 to correct her hammertoes.  After the surgery she experienced some coughing which was thought to be related to having received anesthesia.  She was treated with a course of Levaquin  and prednisone  in case there was an infectious component to the cough. No imaging was performed at that time.  Daughter said that the cough has resolved.  Patient has not complained of being short of breath, chest pain, hoarseness, difficulty swallowing, night sweats, fever or  chills, unintentional weight loss or swelling in neck or face.  Last colonoscopy was 09/2018 and mammogram was 09/2023.  Patient is a never smoker.  She socially been alcohol in the past however has not for many many years.  Patient lives at home with her husband and daughter is close by and very involved in her care.  MEDICAL HISTORY:  Past Medical History:  Diagnosis Date   GERD (gastroesophageal reflux disease)    Hypertension     SURGICAL HISTORY: Past Surgical History:  Procedure Laterality Date   ABDOMINAL HYSTERECTOMY     FOOT SURGERY Left    strightened two toes   KNEE ARTHROPLASTY Bilateral    ROTATOR CUFF REPAIR Right    SALIVARY STONE REMOVAL N/A 03/28/2018   Procedure: TRANSORAL SALIVARY STONE REMOVAL/SIALODUCOPLASTY;  Surgeon: Jesus Oliphant, MD;  Location: [facility] OR;  Service: ENT;  Laterality: N/A;    SOCIAL HISTORY: Social History   Socioeconomic History   Marital status: Married    Spouse name: Not on file   Number of children: Not on file   Years of education: Not on file   Highest education level: Not on file  Occupational History   Not on file  Tobacco Use   Smoking status: Never    Passive exposure: Yes   Smokeless tobacco: Never  Vaping Use   Vaping status: Never Used  Substance and Sexual Activity   Alcohol use: Yes    Comment: Wine once in a while   Drug use: Never   Sexual activity: Not on file  Other Topics Concern   Not on file  Social History Narrative   Not on file   Social Drivers of Health  Financial Resource Strain: Not on file  Food Insecurity: No Food Insecurity (12/06/2023)   Hunger Vital Sign    Worried About Running Out of Food in the Last Year: Never true    Ran Out of Food in the Last Year: Never true  Transportation Needs: No Transportation Needs (12/06/2023)   PRAPARE - Administrator, Civil Service (Medical): No    Lack of Transportation (Non-Medical): No  Physical Activity: Not on file  Stress: Not on file   Social Connections: Unknown (12/07/2023)   Social Connection and Isolation Panel    Frequency of Communication with Friends and Family: More than three times a week    Frequency of Social Gatherings with Friends and Family: More than three times a week    Attends Religious Services: Patient unable to answer    Active Member of Clubs or Organizations: No    Attends Banker Meetings: Never    Marital Status: Married  Catering manager Violence: Not At Risk (12/06/2023)   Humiliation, Afraid, Rape, and Kick questionnaire    Fear of Current or Ex-Partner: No    Emotionally Abused: No    Physically Abused: No    Sexually Abused: No    FAMILY HISTORY: No family history on file.  ALLERGIES:  is allergic to penicillins, clindamycin /lincomycin, codeine, egg-derived products, hydrocodone , and sulfa antibiotics.  MEDICATIONS:  Current Outpatient Medications  Medication Sig Dispense Refill   albuterol  (VENTOLIN  HFA) 108 (90 Base) MCG/ACT inhaler Inhale 2 puffs into the lungs every 6 (six) hours as needed. 6.7 g 0   Apoaequorin (PREVAGEN PO) Take by mouth daily.     Calcium  Citrate-Vitamin D  200-6.25 MG-MCG TABS TAKE ONE TABLET BY MOUTH ONCE a DAY 90 tablet 3   Cyanocobalamin  (B-12 COMPLIANCE INJECTION) 1000 MCG/ML KIT 1,000 mcg.     diclofenac  sodium (VOLTAREN ) 1 % GEL Apply 1 application topically as needed for pain.     escitalopram  (LEXAPRO ) 5 MG tablet Take 2 tablets daily for mood. 60 tablet 5   estradiol  (CLIMARA  - DOSED IN MG/24 HR) 0.1 mg/24hr patch APPLY ONE PATCH TOPICALLY TO THE SKIN ONE TIME A WEEK 12 patch 3   hydrochlorothiazide (HYDRODIURIL) 25 MG tablet Take 25 mg by mouth daily.  3   losartan  (COZAAR ) 50 MG tablet Take 50 mg by mouth daily.     pantoprazole  (PROTONIX ) 40 MG tablet TAKE ONE TABLET (40 MG) BY MOUTH DAILY 90 tablet 3   rosuvastatin  (CRESTOR ) 10 MG tablet Take 1 tablet (10 mg total) by mouth daily. 90 tablet 3   traMADol  (ULTRAM ) 50 MG tablet Take 1  tablet (50 mg total) by mouth every 12 (twelve) hours as needed. 60 tablet 0   [START ON 08/15/2024] traMADol  (ULTRAM ) 50 MG tablet Take 1 tablet (50 mg total) by mouth every 12 (twelve) hours as needed. 60 tablet 0   No current facility-administered medications for this visit.    REVIEW OF SYSTEMS:   All other systems are reviewed and are negative for acute change except as noted in the HPI.  PHYSICAL EXAMINATION: ECOG PERFORMANCE STATUS: 1 - Symptomatic but completely ambulatory  Vitals:   08/06/24 1218  BP: 136/73  Pulse: 73  Resp: 17  Temp: 98.1 F (36.7 C)  SpO2: 99%   Filed Weights   08/06/24 1218  Weight: 209 lb (94.8 kg)    Physical Exam Vitals reviewed.  Constitutional:      Appearance: She is not ill-appearing or toxic-appearing.  HENT:  Head: Normocephalic.     Comments: No facial or neck swelling    Nose: Nose normal.     Mouth/Throat:     Mouth: Mucous membranes are moist.  Eyes:     General: No scleral icterus.    Conjunctiva/sclera: Conjunctivae normal.  Cardiovascular:     Rate and Rhythm: Normal rate and regular rhythm.     Pulses: Normal pulses.     Heart sounds: Normal heart sounds.  Pulmonary:     Effort: Pulmonary effort is normal.     Breath sounds: Normal breath sounds.  Abdominal:     General: There is no distension.  Musculoskeletal:     Cervical back: Normal range of motion.  Skin:    General: Skin is warm and dry.  Neurological:     Mental Status: She is alert and oriented to person, place, and time.     Comments: Unable to recall recent ED visit. Alert to self, location, and time only  Psychiatric:        Mood and Affect: Mood normal.       LABORATORY DATA:  I have reviewed the data as listed    Latest Ref Rng & Units 08/02/2024    7:34 PM 04/26/2024    2:19 PM 12/19/2023   11:05 AM  CBC  WBC 4.0 - 10.5 K/uL 10.3  7.4  5.3   Hemoglobin 12.0 - 15.0 g/dL 86.6  87.9  86.1   Hematocrit 36.0 - 46.0 % 40.6  37.1  41.5    Platelets 150 - 400 K/uL 260  257  315        Latest Ref Rng & Units 08/04/2024   12:24 PM 08/02/2024    7:34 PM 06/01/2024    8:57 AM  CMP  Glucose 70 - 99 mg/dL 80  882  92   BUN 8 - 27 mg/dL 9  14  11    Creatinine 0.57 - 1.00 mg/dL 9.28  8.78  9.19   Sodium 134 - 144 mmol/L 138  137  139   Potassium 3.5 - 5.2 mmol/L 3.8  3.2  4.0   Chloride 96 - 106 mmol/L 101  101  100   CO2 20 - 29 mmol/L 24  27  24    Calcium  8.7 - 10.3 mg/dL 9.1  8.9  9.2   Total Protein 6.0 - 8.5 g/dL 6.3  6.4  6.8   Total Bilirubin 0.0 - 1.2 mg/dL 0.4  0.5  0.4   Alkaline Phos 48 - 129 IU/L 89  74  101   AST 0 - 40 IU/L 27  49  33   ALT 0 - 32 IU/L 32  46  29      RADIOGRAPHIC STUDIES: I have personally reviewed the radiological images as listed and agreed with the findings in the report. CT Angio Chest PE W and/or Wo Contrast Result Date: 08/02/2024 EXAM: CTA CHEST 08/02/2024 09:38:07 PM TECHNIQUE: CTA of the chest was performed after the administration of 75 mL of iohexol  (OMNIPAQUE ) 350 MG/ML intravenous contrast. Multiplanar reformatted images are provided for review. MIP images are provided for review. Automated exposure control, iterative reconstruction, and/or weight based adjustment of the mA/kV was utilized to reduce the radiation dose to as low as reasonably achievable. COMPARISON: None available. CLINICAL HISTORY: Pulmonary embolism (PE) suspected, high prob. Patient BIB EMS from home after a witnessed syncopal event. FINDINGS: PULMONARY ARTERIES: Pulmonary arteries are adequately opacified for evaluation. Breathing motion artifacts in the lower lobes. Motion  creates pseudo filling defects within segmental right lower lobe pulmonary arteries, which do not conform to the vessel distally. This represents atifact. No acute pulmonary embolus. Main pulmonary artery is normal in caliber. MEDIASTINUM: The heart is mildly enlarged. There are coronary artery calcifications. No acute aortic findings. Aortic  atherosclerosis. Smoothly marginated posterior mediastinal mass measuring 3.5 x 2.1 x 3 cm, series 8 image 158. This abuts the right lateral aspect of T5 vertebral body with questionable tail extending to the spinal canal, sagittal series 11 image 108. Hounsfield units are 6. LYMPH NODES: No mediastinal, hilar or axillary lymphadenopathy. LUNGS AND PLEURA: Scattered segmental linear atelectasis. No focal consolidation or pulmonary edema. No evidence of pleural effusion or pneumothorax. UPPER ABDOMEN: Large hiatal hernia, greater than 50% of the stomach is intrathoracic. Calcified granuloma in the left lobe of the liver. SOFT TISSUES AND BONES: Thoracic spondylosis. The bones are subjectively undermineralized. Remote right posterior lower rib fracture. No acute soft tissue abnormality. IMPRESSION: 1. No evidence of pulmonary embolism. 2. 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 Favor a peripheral nerve sheath tumor. Recommend thoracic spine MRI for further assessment. 3. Large hiatal hernia with greater than 50% of the stomach intrathoracic. Electronically signed by: Andrea Gasman MD 08/02/2024 09:53 PM EDT RP Workstation: [id number]   CT HEAD WO CONTRAST ( ) Result Date: 08/02/2024 CLINICAL DATA:  Syncope. EXAM: CT HEAD WITHOUT CONTRAST TECHNIQUE: Contiguous axial images were obtained from the base of the skull through the vertex without intravenous contrast. RADIATION DOSE REDUCTION: This exam was performed according to the departmental dose-optimization program which includes automated exposure control, adjustment of the mA and/or kV according to patient size and/or use of iterative reconstruction technique. COMPARISON:  December 05, 2023 FINDINGS: Brain: There is generalized cerebral atrophy with widening of the extra-axial spaces and ventricular dilatation. There are areas of decreased  attenuation within the white matter tracts of the supratentorial brain, consistent with microvascular disease changes. Vascular: No hyperdense vessel or unexpected calcification. Skull: Normal. Negative for fracture or focal lesion. 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 Sinuses/Orbits: No acute finding. Other: None. IMPRESSION: 1. Generalized cerebral atrophy and microvascular disease changes of the supratentorial brain. 2. No acute intracranial abnormality. 3. Left-sided cochlear implant. Electronically Signed   By: Suzen Dials M.D.   On: 08/02/2024 19:17    ASSESSMENT & PLAN Jennifer Novak is a 83 y.o. female presenting to the [facility] for consultation regarding mediastinal mass. Patient will proceed with laboratory workup today.   #Anterior mediastinal mass - Differentials include peripheral nerve sheath tumor, thymoma, thymic carcinoma, germ cell tumor, multiple myeloma.. - Labs today to check CBC, CMP, LDH, AFP, Beta-HCG, TSH, T3 and T4 level, multiple myeloma panel and light chains. - Need PET scan VS MRI for staging and identified target lesion for biopsy.  - Requested a consultation with cardiothoracic surgery to discuss surgical resection versus biopsy - RTC one workup is complete   #Age related screenings - UTD per age  -Patient will RTC when work up is complete.  Patient expressed understanding of the recommended workup and is agreeable to move forward.   All questions were answered. The patient knows to call the clinic with any problems, questions or concerns.  Shared visit with Dr. Onesimo.  No orders of the  defined types were placed in this encounter.     I have spent a total of 60 minutes minutes of face-to-face and non-face-to-face time, preparing to see the patient, obtaining and/or reviewing separately obtained history,  performing a medically appropriate examination, counseling and educating the patient, ordering medications/tests/procedures, referring and communicating with other health care professionals, documenting clinical information in the electronic health record, independently interpreting results and communicating results to the patient, and care coordination.   Sumner Kirchman Walisiewicz PA-C Department of Hematology/Oncology [facility] at [facility] Phone: [phone]  ADDENDUM .Patient was Personally and independently interviewed, examined and relevant elements of the history of present illness were reviewed in details and an assessment and plan was created. All elements of the patient's history of present illness , assessment and plan were discussed in details with Boss Danielsen Walisiewicz PA-C. The above documentation reflects our combined findings assessment and plan.  . Orders Placed This Encounter  Procedures   NM PET Image Initial (PI) Skull Base To Thigh    Rapid diagnostic pt    Standing Status:   Future    Expected Date:   08/16/2024    Expiration Date:   08/06/2025    If indicated for the ordered procedure, I authorize the administration of a radiopharmaceutical per Radiology protocol:   Yes    Preferred imaging location?:   [facility]   CBC with Differential (Cancer Center Only)   CMP (Cancer Center only)    Standing Status:   Future    Number of Occurrences:   1    Expiration Date:   08/05/2025   Lactate dehydrogenase (LDH)    Standing Status:   Future    Number of Occurrences:   1    Expiration Date:   08/05/2025   AFP tumor marker    Standing Status:   Future    Number of Occurrences:   1    Expiration Date:   08/05/2025   HCG, tumor marker    Standing Status:   Future    Number of Occurrences:   1    Expiration Date:   08/05/2025   TSH    Standing Status:   Future    Number of Occurrences:   1    Expiration Date:   08/05/2025   T4, free    Standing  Status:   Future    Number of Occurrences:   1    Expiration Date:   08/05/2025   T3, free    Standing Status:   Future    Number of Occurrences:   1    Expiration Date:   08/05/2025   Multiple Myeloma Panel (SPEP&IFE w/QIG)   Kappa/lambda light chains    Emaline Saran MD MS

## 2024-08-06 ENCOUNTER — Encounter: Payer: Self-pay | Admitting: Medical Oncology

## 2024-08-06 ENCOUNTER — Inpatient Hospital Stay

## 2024-08-06 ENCOUNTER — Inpatient Hospital Stay: Attending: Physician Assistant | Admitting: Physician Assistant

## 2024-08-06 ENCOUNTER — Ambulatory Visit: Payer: Self-pay | Admitting: Physician Assistant

## 2024-08-06 VITALS — BP 136/73 | HR 73 | Temp 98.1°F | Resp 17 | Ht 67.0 in | Wt 209.0 lb

## 2024-08-06 DIAGNOSIS — Z79899 Other long term (current) drug therapy: Secondary | ICD-10-CM | POA: Diagnosis not present

## 2024-08-06 DIAGNOSIS — I10 Essential (primary) hypertension: Secondary | ICD-10-CM | POA: Diagnosis not present

## 2024-08-06 DIAGNOSIS — K219 Gastro-esophageal reflux disease without esophagitis: Secondary | ICD-10-CM | POA: Diagnosis not present

## 2024-08-06 DIAGNOSIS — F03A3 Unspecified dementia, mild, with mood disturbance: Secondary | ICD-10-CM | POA: Diagnosis not present

## 2024-08-06 DIAGNOSIS — R55 Syncope and collapse: Secondary | ICD-10-CM | POA: Diagnosis not present

## 2024-08-06 DIAGNOSIS — J9859 Other diseases of mediastinum, not elsewhere classified: Secondary | ICD-10-CM | POA: Diagnosis not present

## 2024-08-06 DIAGNOSIS — D649 Anemia, unspecified: Secondary | ICD-10-CM | POA: Insufficient documentation

## 2024-08-06 DIAGNOSIS — E539 Vitamin B deficiency, unspecified: Secondary | ICD-10-CM | POA: Diagnosis not present

## 2024-08-06 DIAGNOSIS — I739 Peripheral vascular disease, unspecified: Secondary | ICD-10-CM | POA: Diagnosis not present

## 2024-08-06 DIAGNOSIS — R9389 Abnormal findings on diagnostic imaging of other specified body structures: Secondary | ICD-10-CM | POA: Diagnosis present

## 2024-08-06 LAB — CBC WITH DIFFERENTIAL (CANCER CENTER ONLY)
Abs Immature Granulocytes: 0.02 K/uL (ref 0.00–0.07)
Basophils Absolute: 0.1 K/uL (ref 0.0–0.1)
Basophils Relative: 1 %
Eosinophils Absolute: 0.1 K/uL (ref 0.0–0.5)
Eosinophils Relative: 2 %
HCT: 38.9 % (ref 36.0–46.0)
Hemoglobin: 13.2 g/dL (ref 12.0–15.0)
Immature Granulocytes: 0 %
Lymphocytes Relative: 23 %
Lymphs Abs: 1.4 K/uL (ref 0.7–4.0)
MCH: 30.1 pg (ref 26.0–34.0)
MCHC: 33.9 g/dL (ref 30.0–36.0)
MCV: 88.8 fL (ref 80.0–100.0)
Monocytes Absolute: 0.3 K/uL (ref 0.1–1.0)
Monocytes Relative: 6 %
Neutro Abs: 4.1 K/uL (ref 1.7–7.7)
Neutrophils Relative %: 68 %
Platelet Count: 262 K/uL (ref 150–400)
RBC: 4.38 MIL/uL (ref 3.87–5.11)
RDW: 13.6 % (ref 11.5–15.5)
WBC Count: 5.9 K/uL (ref 4.0–10.5)
nRBC: 0 % (ref 0.0–0.2)

## 2024-08-06 LAB — CMP (CANCER CENTER ONLY)
ALT: 29 U/L (ref 0–44)
AST: 27 U/L (ref 15–41)
Albumin: 4 g/dL (ref 3.5–5.0)
Alkaline Phosphatase: 83 U/L (ref 38–126)
Anion gap: 7 (ref 5–15)
BUN: 7 mg/dL — ABNORMAL LOW (ref 8–23)
CO2: 30 mmol/L (ref 22–32)
Calcium: 9.8 mg/dL (ref 8.9–10.3)
Chloride: 101 mmol/L (ref 98–111)
Creatinine: 0.74 mg/dL (ref 0.44–1.00)
GFR, Estimated: >60 mL/min (ref >60)
Glucose, Bld: 94 mg/dL (ref 70–99)
Potassium: 3.6 mmol/L (ref 3.5–5.1)
Sodium: 138 mmol/L (ref 135–145)
Total Bilirubin: 0.6 mg/dL (ref 0.0–1.2)
Total Protein: 7.4 g/dL (ref 6.5–8.1)

## 2024-08-06 LAB — URINE CULTURE

## 2024-08-06 LAB — LACTATE DEHYDROGENASE: LDH: 122 U/L (ref 98–192)

## 2024-08-06 LAB — TSH: TSH: 2.77 u[IU]/mL (ref 0.350–4.500)

## 2024-08-06 LAB — T4, FREE: Free T4: 0.85 ng/dL (ref 0.61–1.12)

## 2024-08-06 NOTE — Patient Instructions (Signed)
 Diagnostic Clinic Office Visit Discharge Information and Instructions  Thank you for choosing [facility] for your healthcare needs.  Below is a summary of today's discussion, along with our contact information and an outline of what to expect next.  Reason for Visit:  mediastinal mass  Proposed Diagnostic Care Plan: Labs collected today PET scan ordered. After your insurance approves the scan you will be contacted by hospital staff to schedule the scan.    What to Expect: - Generally, when lab tests are ordered the results can take up to 1 week for results to be available.  At that point, we will contact you to discuss your results with you.  Unless there is a critical result, we will typically wait for all of your lab results to be available before contacting you. - If a biopsy is part of your Care Plan, those results can take on average 7-10 days to result.  Once results are available, we will contact you to discuss your pathology results and any next steps. - If you have additional imaging ordered, such as a CT Scan, MRI, Ultrasound, Bone Scan, or PET scan, your imaging will need to be authorized then scheduled with the earliest available appointment.  You may be asked to travel to another hospital within [facility] who has a sooner availability, please consider doing so if asked. - If you use MyChart, your results will be available to you in the MyChart portal.  Your provider will be in touch with you as soon as all of your results are available to be discussed.  Your Diagnostic Clinic Provider:  Channon Brougher Walisiewicz PA-C and Dr. Onesimo Your Diagnostic Navigator:  Colene Raider RN, office number [phone]  If you or your caregiver have number blocking on your cell phones, please ensure the cancer center's numbers are not blocked.  If you are not a registered MyChart user, please consider enrolling in MyChart to receive your test results and visit notes.  You can also access your  discharge instructions electronically.  MyChart also gives you an electronic means to communicate with your Care Team instead of needing to call in to the cancer center.  We appreciate you trusting us  with your healthcare and look forward to partnering with you as we work to uncover what your potential diagnosis may be.  Please do not hesitate to reach out at any point with questions or concerns.

## 2024-08-06 NOTE — Progress Notes (Signed)
 Labs improved a lot. Potassium normal. Liver enzymes normal. Protein back in normal range.

## 2024-08-06 NOTE — Progress Notes (Signed)
No significant bacteria found in urine culture.

## 2024-08-06 NOTE — Progress Notes (Signed)
 [facility]  Patient presented to clinic, with spouse and daughter, for her scheduled appointment with PA-C Mallie Combes. I introduced myself and provided them with my direct contact information. Patient and family were both encouraged to call me with any questions/concerns they may have.  Colene KYM Raider, RN, BSN, [facility] Oncology Nurse Navigator, [facility] 08/06/2024 1:41 PM

## 2024-08-07 LAB — T3, FREE: T3, Free: 2.6 pg/mL (ref 2.0–4.4)

## 2024-08-07 LAB — AFP TUMOR MARKER: AFP, Serum, Tumor Marker: 6.5 ng/mL (ref 0.0–8.7)

## 2024-08-07 LAB — BETA HCG QUANT (REF LAB): hCG Quant: <1 m[IU]/mL

## 2024-08-09 ENCOUNTER — Encounter: Payer: Self-pay | Admitting: Medical Oncology

## 2024-08-09 LAB — KAPPA/LAMBDA LIGHT CHAINS
Kappa free light chain: 26.1 mg/L — ABNORMAL HIGH (ref 3.3–19.4)
Kappa, lambda light chain ratio: 1.26 (ref 0.26–1.65)
Lambda free light chains: 20.7 mg/L (ref 5.7–26.3)

## 2024-08-10 ENCOUNTER — Encounter (HOSPITAL_COMMUNITY)
Admission: RE | Admit: 2024-08-10 | Discharge: 2024-08-10 | Disposition: A | Discharge status: Home / Self Care | Source: Ambulatory Visit | Attending: Physician Assistant | Admitting: Physician Assistant

## 2024-08-10 DIAGNOSIS — J9859 Other diseases of mediastinum, not elsewhere classified: Secondary | ICD-10-CM | POA: Insufficient documentation

## 2024-08-10 LAB — MULTIPLE MYELOMA PANEL, SERUM
Albumin SerPl Elph-Mcnc: 3.3 g/dL (ref 2.9–4.4)
Albumin/Glob SerPl: 1 (ref 0.7–1.7)
Alpha 1: 0.3 g/dL (ref 0.0–0.4)
Alpha2 Glob SerPl Elph-Mcnc: 0.9 g/dL (ref 0.4–1.0)
B-Globulin SerPl Elph-Mcnc: 1 g/dL (ref 0.7–1.3)
Gamma Glob SerPl Elph-Mcnc: 1.3 g/dL (ref 0.4–1.8)
Globulin, Total: 3.5 g/dL (ref 2.2–3.9)
IgA: 249 mg/dL (ref 64–422)
IgG (Immunoglobin G), Serum: 1265 mg/dL (ref 586–1602)
IgM (Immunoglobulin M), Srm: 137 mg/dL (ref 26–217)
Total Protein ELP: 6.8 g/dL (ref 6.0–8.5)

## 2024-08-10 LAB — GLUCOSE, CAPILLARY: Glucose-Capillary: 80 mg/dL (ref 70–99)

## 2024-08-10 MED ORDER — FLUDEOXYGLUCOSE F - 18 (FDG) INJECTION
10.4500 | Freq: Once | INTRAVENOUS | Status: DC
Start: 2024-08-10 — End: 2024-08-10

## 2024-08-10 MED ORDER — FLUDEOXYGLUCOSE F - 18 (FDG) INJECTION
10.5400 | Freq: Once | INTRAVENOUS | Status: AC
  Administered 2024-08-10: 10.54 via INTRAVENOUS

## 2024-08-11 ENCOUNTER — Ambulatory Visit: Attending: Physician Assistant

## 2024-08-11 ENCOUNTER — Encounter (HOSPITAL_BASED_OUTPATIENT_CLINIC_OR_DEPARTMENT_OTHER): Payer: Self-pay

## 2024-08-11 ENCOUNTER — Telehealth: Payer: Self-pay | Admitting: Physician Assistant

## 2024-08-11 DIAGNOSIS — R770 Abnormality of albumin: Secondary | ICD-10-CM | POA: Insufficient documentation

## 2024-08-11 DIAGNOSIS — R310 Gross hematuria: Secondary | ICD-10-CM | POA: Insufficient documentation

## 2024-08-11 DIAGNOSIS — N182 Chronic kidney disease, stage 2 (mild): Secondary | ICD-10-CM | POA: Insufficient documentation

## 2024-08-11 DIAGNOSIS — R55 Syncope and collapse: Secondary | ICD-10-CM | POA: Insufficient documentation

## 2024-08-11 DIAGNOSIS — R748 Abnormal levels of other serum enzymes: Secondary | ICD-10-CM | POA: Insufficient documentation

## 2024-08-11 NOTE — Telephone Encounter (Signed)
 Pt daughter called in. She understand this is not a physical appt she has to go. The appt is for billing purposes only.

## 2024-08-11 NOTE — Telephone Encounter (Signed)
 I notified Jennifer Novak's daughter Jennifer Novak by phone regarding [name] work up results after reviewing with Dr. Onesimo. Labs are overall unremarkable including no evidence of multiple myeloma, normal thyroid function, and tumor markers are not elevated. PET shows simple cystic lesion. We do not recommend any sampling for biopsy or additional oncology work up. Would recommend follow up with PCP in 6 months to repeat CT chest to monitor for changes. Will have RN navigator Jennifer Novak fax our findings and recommendations to PCP. All of daughter's questions were answered and she expressed understanding of the plan provided.

## 2024-08-11 NOTE — Progress Notes (Unsigned)
 EP to read.

## 2024-08-12 ENCOUNTER — Encounter: Payer: Self-pay | Admitting: Medical Oncology

## 2024-08-12 NOTE — Progress Notes (Signed)
 [facility] for Malignancy  Hand-off Note  08/12/24 10:11 AM  Jennifer Novak [birth year] [id number]  Cancer Care, Care Team: Emaline Saran, Oncologist Kaitlyn Walisiewicz, PA-C Colene Raider, Diagnostic Nurse Navigator  Jennifer Novak was referred to [facility] on August 02, 2024 for evaluation of:  mediastinal mass.  The patient's diagnostic work-up included: 08/06/2024 Labs: CBC w/diff., Multiple Myeloma Panel, Kappa/Lamba light chains, CMP, LDH, AFP tumor marker, HCG Tumor marker, TSH, T4 free, T3 free.  08/10/2024 NM PET Image Initial (PI) Skull Base To Thigh   The patient was found to not have malignancy at this time, as evaluated for the reason for referral stated above.  The recommended follow-up provided to the patient includes:  follow up with PCP in six months to repeat CT chest to monitor for changes.  We thank you for allowing us  to assist in Jennifer Novak's care.  The initial and most recent Progress Notes, labs, imaging, procedure(s), and/or consult notes have been routed to you through [facility] or faxed to your office for continuity of care.   Colene KYM Raider, RN, BSN, [facility] Oncology Nurse Navigator, [facility] 08/12/2024 10:21 AM

## 2024-08-17 ENCOUNTER — Encounter: Payer: Self-pay | Admitting: Physician Assistant

## 2024-08-27 DIAGNOSIS — H903 Sensorineural hearing loss, bilateral: Secondary | ICD-10-CM | POA: Diagnosis not present

## 2024-08-27 DIAGNOSIS — Z45321 Encounter for adjustment and management of cochlear device: Secondary | ICD-10-CM | POA: Diagnosis not present

## 2024-09-09 NOTE — Progress Notes (Signed)
 Patient ID:  Jennifer Novak is a 83 y.o. (DOB 12-18-1940) female.   Assessment   1. Hammer toes of both feet   2. Postop check     Plan   The primary encounter diagnosis was Hammer toes of both feet. A diagnosis of Postop check was also pertinent to this visit.  Patient was seen and treated evaluated with all question concerns answered in great detail.  Patient is completely resolved the hammertoe contractures as well as the heloma molle formation advised patient great detail well proper shoe gear and home care  The patient does indicate an understanding of everything that we have discussed today, all of the patient's questions were answered, the patient will return to the office sooner if problem arises, otherwise the patient will be further evaluated in 3 months   Health maintenance issues including healthy diet and exercise were discussed with the patient.  Risks, benefits, and alternatives of the medications and treatment plan prescribed today were discussed, and patient expressed understanding and agreement with the plan.   No orders of the defined types were placed in this encounter.  No follow-ups on file.   Patient's Medications      * Accurate as of September 09, 2024 12:49 PM. Reflects encounter med changes as of last refresh        Continued Medications     Instructions  cephALEXin  500 mg capsule Commonly known as: KEFLEX   500 mg, Oral, 3 times a day   cholecalciferol 1,000 units (25 mcg) tablet Commonly known as: VITAMIN D -3  1,000 Units, Every morning   escitalopram  5 MG tablet Commonly known as: LEXAPRO   5 mg, Daily   hydroCHLOROthiazide 12.5 mg capsule  12.5 mg, Daily   losartan  potassium 25 mg tablet Commonly known as: COZAAR   50 mg, Every morning   Needles & Syringes Misc  1,000 mcg, Does not apply, Every 30 days   pantoprazole  sodium 40 mg tablet Commonly known as: PROTONIX   40 mg, Every morning   rosuvastatin  calcium  10 mg  tablet Commonly known as: CRESTOR   10 mg, Daily   triamcinolone 0.5% ointment Commonly known as: KENALOG  Topical, 2 times a day      Modified Medications     Instructions  B-12 COMPLIANCE INJECTION 1000 MCG/ML Kit Generic drug: Cyanocobalamin  What changed: when to take this  1,000 mcg, Injection, Weekly         Allergies Allergies[1]  Subjective   HPI Jennifer Novak is a 83 y.o. female who presents for a postoperative evaluation of bilateral 5th hammer digit repair, performed 07/23/24.  Patient denies nausea vomit fever chills shortness of breath and diarrhea.  Objective   Physical Exam:   Musculoskeletal: Incision sites are 100% healed and coapted with no clinical signs of infection or dehiscence.  Manual muscle test 5 out of 5 in all planes motion with no limitation or pain.  All neurovascular structures noted be intact with capillary refill time noted be less than 3 seconds to digits.  Negative Homans and Pratt sign noted.   There were no vitals taken for this visit.   Allergies Allergies[2]  Patient Active Problem List   Diagnosis Date Noted  . Hammer toes of both feet 07/07/2024  . Hammer toe of right foot 04/25/2022  . Combined forms of age-related cataract of left eye 10/17/2016   Past Surgical History:  Procedure Laterality Date  . Ankle surgery    . Cataract extraction Right   .  Cochlear implant Left   . Correction hammer toe    . Dental surgery     IMPLANTS  . Hysterectomy    . Joint replacement Bilateral    KNEE  . Revision total knee arthroplasty Right 02/20/2021  . Shoulder arthroscopy Right    Past Medical History:  Diagnosis Date  . Cataract    LEFT  . COVID 11/28/2022  . Diverticulitis 11/2023  . GERD (gastroesophageal reflux disease)   . Hammer toes, bilateral 07/2024  . HOH (hard of hearing)    RIGHT HEARING AID  . HOH (hard of hearing)   . Hypertension   . Memory deficit   . PONV (postoperative nausea and vomiting)   .  Urinary incontinence    Patient Care Team: Vermell LITTIE Bologna, PA as PCP - General (Physician Assistant)   I dictated a portion of the note using voice recognition software, along with smart phrases and written information directly from the patient that have been entered by myself or by my assistant or verbal information during the face-to-face time entered by my assistant and minor syntax, contextual, and spelling errors may be related to the use of this software and were not intentional. I have reviewed the information contained in this note and personally verified its accuracy.  I obtained the history of present illness and personally performed the physical exam. A written report was generated and sent electronically or by fax to the consulting physician.        [1] Allergies Allergen Reactions  . Clindamycin /Lincomycin Diarrhea  . Codeine Nausea And Vomiting  . Hydrocodone  Nausea And Vomiting  . Latex Rash  . Oxycodone Nausea And Vomiting  . Sulfa Antibiotics Rash  . Iodine Rash  . Penicillins Rash  [2] Allergies Allergen Reactions  . Clindamycin /Lincomycin Diarrhea  . Codeine Nausea And Vomiting  . Hydrocodone  Nausea And Vomiting  . Latex Rash  . Oxycodone Nausea And Vomiting  . Sulfa Antibiotics Rash  . Iodine Rash  . Penicillins Rash  *Some images could not be shown.

## 2024-10-20 ENCOUNTER — Other Ambulatory Visit: Payer: Self-pay

## 2024-10-20 MED ORDER — TRAMADOL HCL 50 MG PO TABS: 50.0000 mg | ORAL_TABLET | Freq: Two times a day (BID) | ORAL | 0 refills | Status: DC | PRN

## 2024-10-20 NOTE — Telephone Encounter (Signed)
..  PDMP reviewed during this encounter. No concerns.  Refill sent.  

## 2024-10-28 ENCOUNTER — Other Ambulatory Visit: Payer: Self-pay

## 2024-10-28 ENCOUNTER — Emergency Department (HOSPITAL_COMMUNITY)
Admission: EM | Admit: 2024-10-28 | Discharge: 2024-10-28 | Disposition: A | Discharge status: Home / Self Care | Attending: Emergency Medicine | Admitting: Emergency Medicine

## 2024-10-28 ENCOUNTER — Encounter (HOSPITAL_COMMUNITY): Payer: Self-pay

## 2024-10-28 DIAGNOSIS — R251 Tremor, unspecified: Secondary | ICD-10-CM | POA: Diagnosis not present

## 2024-10-28 DIAGNOSIS — F0390 Unspecified dementia without behavioral disturbance: Secondary | ICD-10-CM | POA: Diagnosis not present

## 2024-10-28 DIAGNOSIS — Z79899 Other long term (current) drug therapy: Secondary | ICD-10-CM | POA: Diagnosis not present

## 2024-10-28 DIAGNOSIS — R531 Weakness: Secondary | ICD-10-CM | POA: Diagnosis not present

## 2024-10-28 DIAGNOSIS — I10 Essential (primary) hypertension: Secondary | ICD-10-CM | POA: Diagnosis not present

## 2024-10-28 DIAGNOSIS — R55 Syncope and collapse: Secondary | ICD-10-CM | POA: Diagnosis present

## 2024-10-28 LAB — CBC WITH DIFFERENTIAL/PLATELET
Abs Immature Granulocytes: 0.04 K/uL (ref 0.00–0.07)
Basophils Absolute: 0.1 K/uL (ref 0.0–0.1)
Basophils Relative: 1 %
Eosinophils Absolute: 0.1 K/uL (ref 0.0–0.5)
Eosinophils Relative: 1 %
HCT: 39.6 % (ref 36.0–46.0)
Hemoglobin: 13.1 g/dL (ref 12.0–15.0)
Immature Granulocytes: 0 %
Lymphocytes Relative: 10 %
Lymphs Abs: 0.9 K/uL (ref 0.7–4.0)
MCH: 31.3 pg (ref 26.0–34.0)
MCHC: 33.1 g/dL (ref 30.0–36.0)
MCV: 94.5 fL (ref 80.0–100.0)
Monocytes Absolute: 0.5 K/uL (ref 0.1–1.0)
Monocytes Relative: 5 %
Neutro Abs: 7.4 K/uL (ref 1.7–7.7)
Neutrophils Relative %: 83 %
Platelets: 258 K/uL (ref 150–400)
RBC: 4.19 MIL/uL (ref 3.87–5.11)
RDW: 13.4 % (ref 11.5–15.5)
WBC: 8.9 K/uL (ref 4.0–10.5)
nRBC: 0 % (ref 0.0–0.2)

## 2024-10-28 LAB — COMPREHENSIVE METABOLIC PANEL WITH GFR
ALT: 44 U/L (ref 0–44)
AST: 48 U/L — ABNORMAL HIGH (ref 15–41)
Albumin: 4.3 g/dL (ref 3.5–5.0)
Alkaline Phosphatase: 96 U/L (ref 38–126)
Anion gap: 12 (ref 5–15)
BUN: 13 mg/dL (ref 8–23)
CO2: 27 mmol/L (ref 22–32)
Calcium: 9.9 mg/dL (ref 8.9–10.3)
Chloride: 101 mmol/L (ref 98–111)
Creatinine, Ser: 1.02 mg/dL — ABNORMAL HIGH (ref 0.44–1.00)
GFR, Estimated: 54 mL/min — ABNORMAL LOW
Glucose, Bld: 133 mg/dL — ABNORMAL HIGH (ref 70–99)
Potassium: 3.5 mmol/L (ref 3.5–5.1)
Sodium: 141 mmol/L (ref 135–145)
Total Bilirubin: 0.4 mg/dL (ref 0.0–1.2)
Total Protein: 7.6 g/dL (ref 6.5–8.1)

## 2024-10-28 LAB — TROPONIN T, HIGH SENSITIVITY: Troponin T High Sensitivity: <15 ng/L (ref 0–19)

## 2024-10-28 NOTE — ED Triage Notes (Signed)
 Patient bought in by EMS. Family found her sitting on the toilet leaning over. Stated she had some convulsions (seen by daughter) and she was post ictal for a few minutes after. EMS reports a big bowel movement.   No history of seizures. EMS states she had a similar episode a few months ago but was not diagnose with anything. Patient denies pain.

## 2024-10-28 NOTE — ED Provider Notes (Signed)
 " [place] [facility] AT [facility] Provider Note   CSN: [id number] Arrival date & time: 10/28/24  8069     Patient presents with: Seizures and Near Syncope   Jennifer Novak is a 83 y.o. female.  {Add pertinent medical, surgical, social history, OB history to HPI:32947} HPI     Hearing aid, cochlear in left Sitting on toilet, then daughter went to check on her and while changing her pull up she passed out.  Usually when she has syncope, fire department comes and she comes around Got her on the bed, hands and feet just sort of shaking while she was conscious, and did say did I do it again knew what had happened.  When they were putting her in shower she was requesting to lay down, was generally weak, totally dead weight, 10 to 15 minutes came back around.  Came back around, has some dementia but knew it was xmas.  Doesn't think she was straining.   No chest pain or dyspnea Right shoulder pain but family was trying to pull her out of shower B12 low, on b12 Last time this happened it was while at the table June, September for syncope too, hx of passing out even a long time ago   Prior to Admission medications  Medication Sig Start Date End Date Taking? Authorizing Provider  albuterol  (VENTOLIN  HFA) 108 (90 Base) MCG/ACT inhaler Inhale 2 puffs into the lungs every 6 (six) hours as needed. 07/27/24   Breeback, Jade L, PA-C  Apoaequorin (PREVAGEN PO) Take by mouth daily.    [provider]  Calcium  Citrate-Vitamin D  200-6.25 MG-MCG TABS TAKE ONE TABLET BY MOUTH ONCE a DAY 05/21/24   Breeback, Jade L, PA-C  Cyanocobalamin  (B-12 COMPLIANCE INJECTION) 1000 MCG/ML KIT 1,000 mcg. 04/22/24   [provider]  diclofenac  sodium (VOLTAREN ) 1 % GEL Apply 1 application topically as needed for pain. 09/13/16   [provider]  escitalopram  (LEXAPRO ) 5 MG tablet Take 2 tablets daily for mood. 05/19/24   Breeback, Jade L, PA-C  estradiol  (CLIMARA  - DOSED  IN MG/24 HR) 0.1 mg/24hr patch APPLY ONE PATCH TOPICALLY TO THE SKIN ONE TIME A WEEK 03/26/24   Breeback, Jade L, PA-C  hydrochlorothiazide (HYDRODIURIL) 25 MG tablet Take 25 mg by mouth daily. 03/11/18   [provider]  losartan  (COZAAR ) 50 MG tablet Take 50 mg by mouth daily. 10/05/18   [provider]  pantoprazole  (PROTONIX ) 40 MG tablet TAKE ONE TABLET (40 MG) BY MOUTH DAILY 07/13/24   Breeback, Jade L, PA-C  rosuvastatin  (CRESTOR ) 10 MG tablet Take 1 tablet (10 mg total) by mouth daily. 02/23/24 05/23/24  Swinyer, [name], NP  traMADol  (ULTRAM ) 50 MG tablet Take 1 tablet (50 mg total) by mouth every 12 (twelve) hours as needed. 10/20/24 11/19/24  Antoniette Vermell CROME, PA-C    Allergies: Penicillins, Clindamycin /lincomycin, Codeine, Egg protein-containing drug products, Hydrocodone , and Sulfa antibiotics    Review of Systems  Updated Vital Signs BP 117/71 (BP Location: Right Arm)   Pulse 74   Temp 97.9 F (36.6 C) (Oral)   Resp 18   SpO2 97%   Physical Exam  (all labs ordered are listed, but only abnormal results are displayed) Labs Reviewed  COMPREHENSIVE METABOLIC PANEL WITH GFR - Abnormal; Notable for the following components:      Result Value   Glucose, Bld 133 (*)    Creatinine, Ser 1.02 (*)    AST 48 (*)  GFR, Estimated 54 (*)    All other components within normal limits  CBC WITH DIFFERENTIAL/PLATELET  TROPONIN T, HIGH SENSITIVITY    EKG: None  Radiology: No results found.  {Document cardiac monitor, telemetry assessment procedure when appropriate:32947} Procedures   Medications Ordered in the ED - No data to display    {Click here for ABCD2, HEART and other calculators REFRESH Note before signing:1}                              Medical Decision Making Amount and/or Complexity of Data Reviewed Labs: ordered. Radiology: ordered.   ***  {Document critical care time when appropriate  Document review of labs and clinical decision tools ie  CHADS2VASC2, etc  Document your independent review of radiology images and any outside records  Document your discussion with family members, caretakers and with consultants  Document social determinants of health affecting pt's care  Document your decision making why or why not admission, treatments were needed:32947:::1}   Final diagnoses:  None    ED Discharge Orders     None        "

## 2024-11-11 ENCOUNTER — Telehealth (HOSPITAL_BASED_OUTPATIENT_CLINIC_OR_DEPARTMENT_OTHER): Payer: Self-pay | Admitting: *Deleted

## 2024-11-11 ENCOUNTER — Ambulatory Visit: Attending: Nurse Practitioner

## 2024-11-11 DIAGNOSIS — R55 Syncope and collapse: Secondary | ICD-10-CM

## 2024-11-11 NOTE — Telephone Encounter (Signed)
 Pt showed up with spouse today and daughter.  Pt has been have syncope.  Rosaline ordered a live monitor for 14 days to be mailed. Sent in todayl

## 2024-11-11 NOTE — Progress Notes (Unsigned)
 Enrolled patient for a 14 day Zio AT monitor to be mailed to patients home.

## 2024-11-14 ENCOUNTER — Encounter: Payer: Self-pay | Admitting: Physician Assistant

## 2024-11-16 ENCOUNTER — Ambulatory Visit: Admitting: Physician Assistant

## 2024-11-16 ENCOUNTER — Encounter: Payer: Self-pay | Admitting: Physician Assistant

## 2024-11-16 VITALS — BP 133/62 | HR 84 | Ht 67.0 in | Wt 215.0 lb

## 2024-11-16 DIAGNOSIS — G894 Chronic pain syndrome: Secondary | ICD-10-CM | POA: Diagnosis not present

## 2024-11-16 DIAGNOSIS — G4710 Hypersomnia, unspecified: Secondary | ICD-10-CM | POA: Diagnosis not present

## 2024-11-16 DIAGNOSIS — R4589 Other symptoms and signs involving emotional state: Secondary | ICD-10-CM | POA: Diagnosis not present

## 2024-11-16 DIAGNOSIS — L918 Other hypertrophic disorders of the skin: Secondary | ICD-10-CM

## 2024-11-16 DIAGNOSIS — R55 Syncope and collapse: Secondary | ICD-10-CM | POA: Diagnosis not present

## 2024-11-16 MED ORDER — TRAMADOL HCL 50 MG PO TABS: 50.0000 mg | ORAL_TABLET | Freq: Two times a day (BID) | ORAL | 0 refills | Status: AC | PRN

## 2024-11-16 MED ORDER — ESCITALOPRAM OXALATE 10 MG PO TABS: 10.0000 mg | ORAL_TABLET | Freq: Every day | ORAL | 1 refills | Status: DC

## 2024-11-16 NOTE — Progress Notes (Signed)
 "  Established Patient Office Visit  Subjective   Patient ID: Jennifer Novak, female    DOB: Jan 07, 1941  Age: 84 y.o. MRN: [id number]  Chief Complaint  Patient presents with   Hospitalization Follow-up    HPI Discussed the use of AI scribe software for clinical note transcription with the patient, who gave verbal consent to proceed.  History of Present Illness Jennifer Novak is an 84 year old female with a history of vasovagal syncope who presents for follow-up after a syncope and collapse on 10/28/2024. She is accompanied by her daughter.  Syncope and collapse - Syncopal episode occurred on 10/28/2024 while sitting on the toilet - Daughter found her and attempted to assist; patient lost consciousness while trying to be pulled up - Transferred to bed, experienced mild shaking - Regained strength after 10 to 15 minutes - No chest pain or shortness of breath during the episode - History of similar syncopal episodes dating back to her teens  Cardiac evaluation - EKG demonstrated sinus rhythm - Laboratory tests showed no clinically significant anemia, leukocytosis, or electrolyte abnormalities - Troponin levels were normal - History of atrial fibrillation, noted by the fire department - Currently set up with a heart monitor for arrhythmia detection - Evaluated by cardiology  Neurological symptoms - Mild shaking observed during the syncopal episode - No mention of persistent neurological deficits following the event  Medical devices and limitations - Receives B12 infusions - Cochlear implant present, which complicates the ability to perform certain diagnostic tests such as MRI  Husband is concerned about patient sleeping all day.     ROS See HPI.    Objective:     BP 133/62   Pulse 84   Ht 5' 7 (1.702 m)   Wt 215 lb (97.5 kg)   SpO2 99%   BMI 33.67 kg/m  BP Readings from Last 3 Encounters:  11/16/24 133/62  10/28/24 (!) 121/55  08/06/24 136/73   Wt Readings  from Last 3 Encounters:  11/16/24 215 lb (97.5 kg)  08/06/24 209 lb (94.8 kg)  08/04/24 210 lb (95.3 kg)    Cryotherapy Procedure Note  Pre-operative Diagnosis: skin tag  Post-operative Diagnosis: skin tag  Locations: under left eye  Indications: irrigation   Procedure Details  History of allergy to iodine: no. Pacemaker? no.  Patient informed of risks (permanent scarring, infection, light or dark discoloration, bleeding, infection, weakness, numbness and recurrence of the lesion) and benefits of the procedure and  informed consent obtained.  The areas are treated with liquid nitrogen therapy, frozen until ice ball extended 1 mm beyond lesion, allowed to thaw, and treated again. The patient tolerated procedure well.  The patient was instructed on post-op care, warned that there may be blister formation, redness and pain. Recommend OTC analgesia as needed for pain.  Condition: Stable  Complications: none.  Plan: 1. Instructed to keep the area dry and covered for 24-48h and clean thereafter. 2. Warning signs of infection were reviewed.   3. Recommended that the patient use OTC acetaminophen  as needed for pain.     Physical Exam Constitutional:      Appearance: Normal appearance. She is obese.  HENT:     Head: Normocephalic.  Cardiovascular:     Rate and Rhythm: Normal rate.  Pulmonary:     Effort: Pulmonary effort is normal.  Skin:    Comments: 1mm by 1mm skin tag under left eye towards nasal bridge.   Neurological:     General: No  focal deficit present.     Mental Status: She is alert and oriented to person, place, and time.  Psychiatric:        Mood and Affect: Mood normal.       Assessment & Plan:  .[name] was seen today for hospitalization follow-up.  Diagnoses and all orders for this visit:  Syncope, unspecified syncope type  Sleeping excessive -     escitalopram  (LEXAPRO ) 10 MG tablet; Take 1 tablet (10 mg total) by mouth daily.  Depressed  mood -     escitalopram  (LEXAPRO ) 10 MG tablet; Take 1 tablet (10 mg total) by mouth daily.  Skin tag  Chronic pain syndrome -     traMADol  (ULTRAM ) 50 MG tablet; Take 1 tablet (50 mg total) by mouth every 12 (twelve) hours as needed. -     traMADol  (ULTRAM ) 50 MG tablet; Take 1 tablet (50 mg total) by mouth every 12 (twelve) hours as needed.    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 Cardiology follow-up with heart monitor to assess arrhythmias. Seizure unlikely due to consciousness and absence of postictal state. Vitals look good today. Review of hospital work up was great.  - Continue heart monitor. - Educated on elevating legs during episodes. - Keep follow up with cardiology and neurology.   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 Cryotherapy performed with liquid nitrogen. - Performed cryotherapy with liquid nitrogen.  Depressed mood - patient admits she has nothing to do. She can't hear to listen to books or watch TV. It is very hard for her to keep up with conversations.  - she does things when her daughter comes around or people come around to visit.  - increased lexapro  to 10mg  daily    Return in about 6 months (around 05/16/2025).    Hermenia Fritcher, PA-C  "

## 2024-11-16 NOTE — Patient Instructions (Signed)
Increased lexapro to 10mg  daily

## 2024-11-17 ENCOUNTER — Ambulatory Visit: Admitting: Physician Assistant

## 2024-11-22 ENCOUNTER — Other Ambulatory Visit: Payer: Self-pay | Admitting: Physician Assistant

## 2024-11-22 DIAGNOSIS — R4589 Other symptoms and signs involving emotional state: Secondary | ICD-10-CM

## 2024-11-22 DIAGNOSIS — G4710 Hypersomnia, unspecified: Secondary | ICD-10-CM

## 2024-11-23 MED ORDER — ESCITALOPRAM OXALATE 10 MG PO TABS: 10.0000 mg | ORAL_TABLET | Freq: Every day | ORAL | 1 refills | Status: AC

## 2024-11-27 ENCOUNTER — Other Ambulatory Visit: Payer: Self-pay | Admitting: Physician Assistant

## 2024-11-28 ENCOUNTER — Emergency Department (HOSPITAL_BASED_OUTPATIENT_CLINIC_OR_DEPARTMENT_OTHER)

## 2024-11-28 ENCOUNTER — Encounter (HOSPITAL_BASED_OUTPATIENT_CLINIC_OR_DEPARTMENT_OTHER): Payer: Self-pay | Admitting: Emergency Medicine

## 2024-11-28 ENCOUNTER — Emergency Department (HOSPITAL_BASED_OUTPATIENT_CLINIC_OR_DEPARTMENT_OTHER)
Admission: EM | Admit: 2024-11-28 | Discharge: 2024-11-28 | Disposition: A | Discharge status: Home / Self Care | Attending: Emergency Medicine | Admitting: Emergency Medicine

## 2024-11-28 ENCOUNTER — Other Ambulatory Visit: Payer: Self-pay

## 2024-11-28 DIAGNOSIS — F0390 Unspecified dementia without behavioral disturbance: Secondary | ICD-10-CM | POA: Diagnosis not present

## 2024-11-28 DIAGNOSIS — W19XXXA Unspecified fall, initial encounter: Secondary | ICD-10-CM

## 2024-11-28 DIAGNOSIS — M25561 Pain in right knee: Secondary | ICD-10-CM | POA: Insufficient documentation

## 2024-11-28 DIAGNOSIS — Z23 Encounter for immunization: Secondary | ICD-10-CM | POA: Insufficient documentation

## 2024-11-28 DIAGNOSIS — S0993XA Unspecified injury of face, initial encounter: Secondary | ICD-10-CM | POA: Diagnosis present

## 2024-11-28 DIAGNOSIS — Z79899 Other long term (current) drug therapy: Secondary | ICD-10-CM | POA: Diagnosis not present

## 2024-11-28 DIAGNOSIS — S61213A Laceration without foreign body of left middle finger without damage to nail, initial encounter: Secondary | ICD-10-CM | POA: Insufficient documentation

## 2024-11-28 DIAGNOSIS — W01198A Fall on same level from slipping, tripping and stumbling with subsequent striking against other object, initial encounter: Secondary | ICD-10-CM | POA: Insufficient documentation

## 2024-11-28 DIAGNOSIS — S01511A Laceration without foreign body of lip, initial encounter: Secondary | ICD-10-CM | POA: Diagnosis not present

## 2024-11-28 MED ORDER — BUPIVACAINE HCL (PF) 0.5 % IJ SOLN
20.0000 mL | Freq: Once | INTRAMUSCULAR | Status: AC
  Administered 2024-11-28: 20 mL
  Filled 2024-11-28: qty 20

## 2024-11-28 MED ORDER — TETANUS-DIPHTH-ACELL PERTUSSIS 5-2-15.5 LF-MCG/0.5 IM SUSP
0.5000 mL | Freq: Once | INTRAMUSCULAR | Status: AC
  Administered 2024-11-28: 0.5 mL via INTRAMUSCULAR
  Filled 2024-11-28: qty 0.5

## 2024-11-28 MED ORDER — LIDOCAINE-EPINEPHRINE-TETRACAINE (LET) TOPICAL GEL: 3.0000 mL | Freq: Once | TOPICAL | Status: DC

## 2024-11-28 MED ORDER — LIDOCAINE VISCOUS HCL 2 % MT SOLN
15.0000 mL | Freq: Once | OROMUCOSAL | Status: DC
  Filled 2024-11-28: qty 15

## 2024-11-28 NOTE — Discharge Instructions (Signed)
 Today you were seen after a fall.  Your sutures in your lip should absorb on their own.  He will need to go to your primary care or urgent care in 7 to 10 days for evaluation of suture removal for your finger.  Please return to the ED if you have signs of infection to include red streaking around your wounds, fever, or increased pain.  Thank you for letting us  treat you today. After reviewing your imaging, I feel you are safe to go home. Please follow up with your PCP in the next several days and provide them with your records from this visit. Return to the Emergency Room if pain becomes severe or symptoms worsen.

## 2024-11-28 NOTE — ED Triage Notes (Signed)
 Pt fell forward onto hardwood floor, possibly hit a heater; lac to upper lip; pain to LT middle finger; no LOC, no thinners

## 2024-11-28 NOTE — ED Notes (Signed)
 XR at bedside

## 2024-11-28 NOTE — ED Notes (Signed)
 ED Provider at bedside for lac repair

## 2024-11-28 NOTE — ED Notes (Signed)
 ED Provider at bedside.

## 2024-11-28 NOTE — ED Provider Notes (Signed)
 " [facility] Provider Note   CSN: [id number] Arrival date & time: 11/28/24  1429     Patient presents with: [place]   Jennifer Novak is a 84 y.o. female.  Presents today after a witnessed mechanical fall after tripping over a heater and possibly hitting her face on the heater.  Patient reports right knee pain, left middle finger pain, and leg pain.  Patient denies LOC, blood thinner use, numbness, weakness, nausea, vomiting, any other complaints at this time.  Patient's daughter states patient does have a history of dementia, but does appear to be at her baseline.   HPI     Prior to Admission medications  Medication Sig Start Date End Date Taking? Authorizing Provider  albuterol  (VENTOLIN  HFA) 108 (90 Base) MCG/ACT inhaler Inhale 2 puffs into the lungs every 6 (six) hours as needed. 07/27/24   Breeback, Jade L, PA-C  Apoaequorin (PREVAGEN PO) Take by mouth daily.    [provider]  Calcium  Citrate-Vitamin D  200-6.25 MG-MCG TABS TAKE ONE TABLET BY MOUTH ONCE a DAY 05/21/24   Breeback, Jade L, PA-C  Cyanocobalamin  (B-12 COMPLIANCE INJECTION) 1000 MCG/ML KIT 1,000 mcg. 04/22/24   [provider]  diclofenac  sodium (VOLTAREN ) 1 % GEL Apply 1 application topically as needed for pain. 09/13/16   [provider]  escitalopram  (LEXAPRO ) 10 MG tablet Take 1 tablet (10 mg total) by mouth daily. 11/23/24   Breeback, Jade L, PA-C  estradiol  (CLIMARA  - DOSED IN MG/24 HR) 0.1 mg/24hr patch APPLY ONE PATCH TOPICALLY TO THE SKIN ONE TIME A WEEK 03/26/24   Breeback, Jade L, PA-C  hydrochlorothiazide (HYDRODIURIL) 25 MG tablet Take 25 mg by mouth daily. 03/11/18   [provider]  losartan  (COZAAR ) 50 MG tablet Take 50 mg by mouth daily. 10/05/18   [provider]  pantoprazole  (PROTONIX ) 40 MG tablet TAKE ONE TABLET (40 MG) BY MOUTH DAILY 07/13/24   Breeback, Jade L, PA-C  rosuvastatin  (CRESTOR ) 10 MG tablet Take 1 tablet (10  mg total) by mouth daily. 02/23/24 05/23/24  Swinyer, [name], NP  traMADol  (ULTRAM ) 50 MG tablet Take 1 tablet (50 mg total) by mouth every 12 (twelve) hours as needed. 11/16/24 12/16/24  Breeback, Jade L, PA-C  traMADol  (ULTRAM ) 50 MG tablet Take 1 tablet (50 mg total) by mouth every 12 (twelve) hours as needed. 12/16/24 01/15/25  Breeback, Jade L, PA-C    Allergies: Penicillins, Clindamycin /lincomycin, Codeine, Egg protein-containing drug products, Hydrocodone , and Sulfa antibiotics    Review of Systems  Musculoskeletal:  Positive for arthralgias.  Skin:  Positive for wound.    Updated Vital Signs BP (!) 152/72   Pulse 63   Temp 97.7 F (36.5 C)   Resp 20   Ht 5' 7 (1.702 m)   Wt 97.5 kg   SpO2 99%   BMI 33.67 kg/m   Physical Exam Vitals and nursing note reviewed.  Constitutional:      General: She is not in acute distress.    Appearance: She is well-developed and normal weight.  HENT:     Head: Normocephalic.      Comments: Approximately 2 cm hemostatic laceration across the patient's right upper lip extending through the vermilion border.    Right Ear: External ear normal.     Left Ear: External ear normal.  Eyes:     Conjunctiva/sclera: Conjunctivae normal.  Cardiovascular:     Rate and Rhythm: Normal rate and regular rhythm.  Pulses: Normal pulses.     Heart sounds: Normal heart sounds. No murmur heard. Pulmonary:     Effort: Pulmonary effort is normal. No respiratory distress.     Breath sounds: Normal breath sounds.  Abdominal:     Palpations: Abdomen is soft.     Tenderness: There is no abdominal tenderness.  Musculoskeletal:        General: Tenderness present. No swelling.     Cervical back: Normal range of motion and neck supple.     Comments: Tenderness to palpation without obvious deformity of the right knee.  Patient also has approximately 2 cm laceration noted to the left middle finger along the dorsum aspect of the middle phalanx.  Skin:     General: Skin is warm and dry.     Capillary Refill: Capillary refill takes less than 2 seconds.  Neurological:     Mental Status: She is alert. Mental status is at baseline.  Psychiatric:        Mood and Affect: Mood normal.     (all labs ordered are listed, but only abnormal results are displayed) Labs Reviewed - No data to display  EKG: None  Radiology: CT Cervical Spine Wo Contrast Result Date: 11/28/2024 EXAM: CT CERVICAL SPINE WITHOUT CONTRAST 11/28/2024 03:21:00 PM TECHNIQUE: CT of the cervical spine was performed without the administration of intravenous contrast. Multiplanar reformatted images are provided for review. Automated exposure control, iterative reconstruction, and/or weight based adjustment of the mA/kV was utilized to reduce the radiation dose to as low as reasonably achievable. COMPARISON: None available. CLINICAL HISTORY: Neck trauma (Age >= 65y). Neck trauma. Age >= 65 years. FINDINGS: BONES AND ALIGNMENT: Trace degenerative anterolisthesis of C4 on C5 and C5 on C6 as well as trace degenerative retrolisthesis of C6 on C7 trace degenerative anterolisthesis of C7 on T1. No evidence of traumatic malalignment. No acute fracture. DEGENERATIVE CHANGES: There is moderate to severe disc space narrowing at C6-C7 most pronounced posteriorly. Facet arthrosis and uncovertebral hypertrophy at multiple levels. Significant foraminal stenosis at multiple levels in particular at C3-C4 and C4-C5 and at C6-C7. There is no high grade osseous spinal canal stenosis. SOFT TISSUES: Retropharyngeal course of the right internal carotid artery resulting in indentation of the right posterolateral aspect of the oropharynx. No prevertebral soft tissue swelling. LUNGS: Biapical pleural scarring. IMPRESSION: 1. No evidence of acute traumatic injury. 2. Degenerative cervical spondylosis with moderate to severe disc space narrowing at C6-C7 and significant foraminal stenosis at multiple levels, particularly  at C3-C4, C4-C5, and C6-C7. Electronically signed by: Donnice Mania MD 11/28/2024 04:01 PM EST RP Workstation: [id number]   CT Maxillofacial WO CM Result Date: 11/28/2024 EXAM: CT OF THE FACE WITHOUT CONTRAST 11/28/2024 03:21:00 PM TECHNIQUE: CT of the face was performed without the administration of intravenous contrast. Multiplanar reformatted images are provided for review. Automated exposure control, iterative reconstruction, and/or weight based adjustment of the mA/kV was utilized to reduce the radiation dose to as low as reasonably achievable. COMPARISON: None available. CLINICAL HISTORY: Facial trauma, blunt. FINDINGS: FACIAL BONES: Edentulous maxilla with metallic dental implant components noted. There is associated chronic bone loss of the maxilla. Edentulous mandible with metallic dental implant components. There is also chronic bone loss of the mandible. No acute maxillofacial fracture. There are degenerative changes of the temporomandibular joints, more pronounced on the left. Chronic deformities of the bilateral nasal bones. No underlying osseous defect associated with the right upper lip soft tissue defect. No mandibular dislocation. No suspicious bone lesion. ORBITS:  Globes are intact. No acute traumatic injury. No inflammatory change. SINUSES AND MASTOIDS: Mucosal thickening in the maxillary sinus is right greater than left. Partially visualized left cochlear implant. SOFT TISSUES: There is a soft tissue defect involving the right upper lip with a depth of approximately 7 mm and extending over a length of at least 18 mm (series 3, image 43). Associated soft tissue swelling of the upper lip and asymmetric soft tissue swelling in the right buccal space just inferior to the maxilla. IMPRESSION: 1. No acute maxillofacial fracture. 2. Soft tissue defect involving the right upper lip with associated soft tissue swelling of the upper lip and asymmetric soft tissue swelling in the right buccal space. No  underlying osseous defect. 3. Chronic deformities of the bilateral nasal bones. Electronically signed by: Donnice Mania MD 11/28/2024 03:57 PM EST RP Workstation: [id number]   CT Head Wo Contrast Result Date: 11/28/2024 EXAM: CT HEAD WITHOUT CONTRAST 11/28/2024 03:21:00 PM TECHNIQUE: CT of the head was performed without the administration of intravenous contrast. Automated exposure control, iterative reconstruction, and/or weight based adjustment of the mA/kV was utilized to reduce the radiation dose to as low as reasonably achievable. COMPARISON: 08/02/2024 CLINICAL HISTORY: Head trauma, minor (Age >= 65 years). FINDINGS: BRAIN AND VENTRICLES: Proportional prominence of ventricles and sulci, consistent with diffuse cerebral parenchymal volume loss. Periventricular and subcortical white matter hypoattenuation, consistent with moderate chronic ischemic microvascular disease. No acute hemorrhage. No evidence of acute infarct. No hydrocephalus. No extra-axial collection. No mass effect or midline shift. ORBITS: Bilateral lens replacements. SINUSES: No acute abnormality. SOFT TISSUES AND SKULL: No acute soft tissue abnormality. No skull fracture. VASCULATURE: Calcified atherosclerotic plaque within cavernous/supraclinoid internal carotid arteries. LIMITATIONS/ARTIFACTS: Streak artifact from left cochlear implant limits evaluation particularly over the posterior left cerebral hemisphere. IMPRESSION: 1. No acute intracranial abnormality. Electronically signed by: Donnice Mania MD 11/28/2024 03:49 PM EST RP Workstation: [id number]   DG Finger Middle Left Result Date: 11/28/2024 EXAM: 3 VIEW(S) XRAY OF THE LEFT FINGER(S) 11/28/2024 03:18:47 PM COMPARISON: None available. CLINICAL HISTORY: Pain. FINDINGS: BONES AND JOINTS: No acute fracture. No malalignment. 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 SOFT TISSUES: Soft tissue defect dorsal aspect of the mid third digit. IMPRESSION: 1.  Soft tissue defect at the dorsal aspect of the mid third digit. 2. 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 Electronically signed by: Elsie Gravely MD 11/28/2024 03:24 PM EST RP Workstation: [id number]   DG Knee Complete 4 Views Right Result Date: 11/28/2024 EXAM: 4 OR MORE VIEW(S) XRAY OF THE RIGHT KNEE 11/28/2024 03:18:47 PM COMPARISON: None available. CLINICAL HISTORY: Pain. 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 Submitted components appear well seated. No evidence of acute fracture or dislocation. No focal bone lesion or bone destruction. Bone cortex appears intact. No significant joint effusion. SOFT TISSUES: Unremarkable. IMPRESSION: 1. No evidence of acute fracture or dislocation. 2. 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 Electronically signed by: Elsie Gravely MD 11/28/2024 03:23 PM EST RP Workstation: [id number]     .Laceration Repair  Date/Time: 11/28/2024 5:10 PM  Performed by: Francis Ileana SAILOR, PA-C Authorized by: Francis Ileana SAILOR, PA-C   Consent:    Consent obtained:  Verbal   Consent given by:  Patient and healthcare agent   Risks discussed:  Infection, poor cosmetic result and pain   Alternatives discussed:  No treatment Universal protocol:  Imaging studies available: yes     Patient identity confirmed:  Arm band Anesthesia:    Anesthesia method:  Nerve block   Block location:  Infraorbital nerve block   Block needle gauge:  25 G   Block anesthetic:  Bupivacaine  0.5% w/o epi   Block injection procedure:  Anatomic landmarks identified, introduced needle and negative aspiration for blood   Block outcome:  Anesthesia achieved Laceration details:    Location:  Lip   Lip location:  Upper interior lip and upper exterior lip   Length (cm):  3   Depth (mm):  5 Pre-procedure details:    Preparation:   Patient was prepped and draped in usual sterile fashion Exploration:    Hemostasis achieved with:  Direct pressure   Imaging outcome: foreign body not noted     Wound exploration: wound explored through full range of motion   Treatment:    Area cleansed with:  Saline   Amount of cleaning:  Extensive   Irrigation solution:  Sterile saline Skin repair:    Repair method:  Sutures   Suture size:  5-0   Suture material:  Fast-absorbing gut   Suture technique:  Simple interrupted   Number of sutures:  14 Approximation:    Approximation:  Close   Vermilion border well-aligned: yes   Repair type:    Repair type:  Intermediate Post-procedure details:    Dressing:  Open (no dressing)   Procedure completion:  Tolerated .Laceration Repair  Date/Time: 11/28/2024 5:12 PM  Performed by: Francis Ileana SAILOR, PA-C Authorized by: Francis Ileana SAILOR, PA-C   Consent:    Consent obtained:  Verbal   Consent given by:  Patient and healthcare agent   Risks discussed:  Infection, poor cosmetic result and pain   Alternatives discussed:  No treatment Universal protocol:    Test results available: yes     Patient identity confirmed:  Arm band Anesthesia:    Anesthesia method:  Nerve block   Block location:  Digital block   Block needle gauge:  25 G   Block anesthetic:  Bupivacaine  0.5% w/o epi   Block injection procedure:  Anatomic landmarks identified, introduced needle, negative aspiration for blood and anatomic landmarks palpated   Block outcome:  Anesthesia achieved Laceration details:    Location:  Finger   Finger location:  L long finger   Length (cm):  2.5   Depth (mm):  2 Pre-procedure details:    Preparation:  Imaging obtained to evaluate for foreign bodies Exploration:    Hemostasis achieved with:  Direct pressure   Imaging obtained: x-ray     Imaging outcome: foreign body not noted   Treatment:    Area cleansed with:  Saline   Amount of cleaning:  Extensive Skin repair:    Repair  method:  Sutures   Suture size:  4-0   Suture material:  Nylon   Suture technique:  Simple interrupted   Number of sutures:  7 Approximation:    Approximation:  Close Repair type:    Repair type:  Simple Post-procedure details:    Dressing:  Open (no dressing)    Medications Ordered in the ED  lidocaine  (XYLOCAINE ) 2 % viscous mouth solution 15 mL (has no administration in time range)  Tdap (ADACEL ) injection 0.5 mL (0.5 mLs Intramuscular Given 11/28/24 1456)  bupivacaine (PF) (MARCAINE ) 0.5 % injection 20 mL (20 mLs Infiltration Given by Other 11/28/24 1456)  Medical Decision Making Amount and/or Complexity of Data Reviewed Radiology: ordered.  Risk Prescription drug management.   This patient presents to the ED for concern of mechanical fall differential diagnosis includes laceration, abrasion, fracture, dislocation, brain bleed    Imaging Studies ordered:  I ordered imaging studies including CT head, C-spine, maxillofacial Noncon I independently visualized and interpreted imaging which showed no acute findings I agree with the radiologist interpretation Right knee x-rays: No evidence of acute fracture or dislocation. Left middle finger x-ray: Soft tissue defect at the dorsal aspect of the mid third digit   Medicines ordered and prescription drug management:  I ordered medication including Tdap    I have reviewed the patients home medicines and have made adjustments as needed   Problem List / ED Course:  Considered for admission or further workup however patient's vital signs, physical exam, and imaging are reassuring.  Patient's lacerations were repaired while in ED.  Patient given return precautions.  I feel patient safe for discharge at this time.     Final diagnoses:  Fall, initial encounter  Lip laceration, initial encounter  Laceration of left middle finger without foreign body without damage to nail, initial encounter     ED Discharge Orders     None          Francis Ileana SAILOR, PA-C 11/28/24 1714  "

## 2024-12-03 ENCOUNTER — Encounter: Payer: Self-pay | Admitting: Physician Assistant

## 2024-12-03 MED ORDER — DOXYCYCLINE HYCLATE 100 MG PO TABS: 100.0000 mg | ORAL_TABLET | Freq: Two times a day (BID) | ORAL | 0 refills | Status: AC

## 2024-12-07 ENCOUNTER — Ambulatory Visit: Admitting: Physician Assistant

## 2024-12-07 ENCOUNTER — Encounter: Payer: Self-pay | Admitting: Physician Assistant

## 2024-12-07 VITALS — BP 136/74 | HR 76 | Ht 67.0 in | Wt 215.0 lb

## 2024-12-07 DIAGNOSIS — W19XXXD Unspecified fall, subsequent encounter: Secondary | ICD-10-CM

## 2024-12-07 DIAGNOSIS — S61213D Laceration without foreign body of left middle finger without damage to nail, subsequent encounter: Secondary | ICD-10-CM

## 2024-12-07 DIAGNOSIS — S01511D Laceration without foreign body of lip, subsequent encounter: Secondary | ICD-10-CM

## 2024-12-07 NOTE — Patient Instructions (Signed)
 Taking Out Stitches (Suture Removal): What to Know After After your stitches have been taken out, it's common to feel a little sore and see some swelling. Your skin might also be a little red in the area where the stitches were. Your health care provider may put skin glue or tape strips on your wound after the stitches are gone. Follow these instructions at home: Wound care  Take care of your wound as told. Make sure you: Wash your hands with soap and water for at least 20 seconds before and after you change your bandage. If you can't use soap and water, use hand sanitizer. Change your bandage. Leave skin glue alone. Leave tape strips alone unless you're told to take them off. You may trim the edges of the tape strips if they curl up. Check the area around your wound every day for signs of infection. Check for: More redness, swelling, or pain. Fluid or blood. Warmth. Pus or a bad smell. Apply cream or ointment only as told. Keep your wound safe from injury. Do not pick at the wound. Picking can cause infection. Scar care When your wound is healed, you can help make your scar smaller by: Wearing sunscreen over the scar or covering it with clothes when you go outside. New scars can burn easily. Sunburn can make scars worse. Gently massaging the scarred area. This can help make the scar thinner. General instructions Do not take baths, swim, or use a hot tub until you're told it's OK. Ask if you can shower. Take your medicines only as told. Contact a health care provider if: You have a fever. You have any signs of infection. Your wound opens up. Get help right away if: You see red streaks coming from your wound. This information is not intended to replace advice given to you by your health care provider. Make sure you discuss any questions you have with your health care provider. Document Revised: 06/29/2024 Document Reviewed: 06/29/2024 Elsevier Patient Education  2025 [employer].

## 2024-12-07 NOTE — Progress Notes (Signed)
" ° °  Established Patient Office Visit  Subjective   Patient ID: Jennifer Novak, female    DOB: 04/02/1941  Age: 84 y.o. MRN: [id number]  Chief Complaint  Patient presents with   Hospitalization Follow-up    Fall Suture rmv    HPI .Discussed the use of AI scribe software for clinical note transcription with the patient, who gave verbal consent to proceed.  History of Present Illness LAAIBAH Jennifer Novak is an 84 year old female who presents for suture removal following a facial injury.  Facial laceration and suture management - Sustained a facial injury after a fall, requiring suturing. - Two sutures placed on the inside of the lip and 14 non-dissolvable sutures placed left middle finger.  - Presenting for removal of external non-dissolvable sutures. - Called office and started on doxycycline  a few days ago because finger was becoming red and more painful.  - wound is doing better since starting doxycycline .   Wound healing and care - Scab on the mouth has been coming off in pieces. - Applies Vaseline to the area as part of wound care regimen.  Imaging and initial evaluation - Underwent several CT scans during initial treatment.    ROS See HPI.    Objective:     BP 136/74   Pulse 76   Ht 5' 7 (1.702 m)   Wt 215 lb (97.5 kg)   SpO2 99%   BMI 33.67 kg/m  BP Readings from Last 3 Encounters:  12/07/24 136/74  11/28/24 (!) 152/72  11/16/24 133/62   Wt Readings from Last 3 Encounters:  12/07/24 215 lb (97.5 kg)  11/28/24 215 lb (97.5 kg)  11/16/24 215 lb (97.5 kg)      Physical Exam 14 simple interrupted sutures of left middle finger.  Well healed laceration of upper right lip.   Soaked in hibiclens . 14 sutures removed in office today without complication. Vasoline placed over laceration and wrapped with non stick bandage and coban.    Assessment & Plan:  .Averee was seen today for hospitalization follow-up.  Diagnoses and all orders for this visit:  Fall,  subsequent encounter  Lip laceration, subsequent encounter  Laceration of left middle finger without foreign body without damage to nail, subsequent encounter   Mechanical fall with lip and finger lacerations.  Healing well today.  Finish doxycycline .  14 sutures removed from finger.  HO given for post suture removal care.  Follow up as needed.      Bassheva Flury, PA-C  "

## 2025-01-13 ENCOUNTER — Ambulatory Visit (HOSPITAL_BASED_OUTPATIENT_CLINIC_OR_DEPARTMENT_OTHER): Admitting: Nurse Practitioner

## 2025-05-16 ENCOUNTER — Ambulatory Visit: Admitting: Physician Assistant
